# Patient Record
Sex: Female | Born: 1970 | Race: White | Hispanic: No | Marital: Married | Smoking: Never smoker
Health system: Southern US, Community
[De-identification: ages and names within clinical notes are randomized; demographics above are authoritative.]

## PROBLEM LIST (undated history)

## (undated) DIAGNOSIS — R519 Headache, unspecified: Secondary | ICD-10-CM

## (undated) DIAGNOSIS — Z9889 Other specified postprocedural states: Secondary | ICD-10-CM

## (undated) DIAGNOSIS — C539 Malignant neoplasm of cervix uteri, unspecified: Secondary | ICD-10-CM

## (undated) DIAGNOSIS — K219 Gastro-esophageal reflux disease without esophagitis: Secondary | ICD-10-CM

## (undated) HISTORY — DX: Malignant neoplasm of cervix uteri, unspecified: C53.9

## (undated) HISTORY — PX: CHOLECYSTECTOMY: SHX55

---

## 2010-10-20 ENCOUNTER — Ambulatory Visit
Admission: RE | Admit: 2010-10-20 | Discharge: 2010-10-20 | Payer: Self-pay | Source: Home / Self Care | Admitting: Gynecology

## 2010-11-10 ENCOUNTER — Encounter: Payer: Self-pay | Admitting: Obstetrics & Gynecology

## 2010-11-10 ENCOUNTER — Inpatient Hospital Stay (HOSPITAL_COMMUNITY): Admission: RE | Admit: 2010-11-10 | Discharge: 2010-11-12 | Payer: Self-pay | Admitting: Obstetrics & Gynecology

## 2010-11-10 HISTORY — PX: ABDOMINAL HYSTERECTOMY: SHX81

## 2010-12-16 ENCOUNTER — Ambulatory Visit
Admission: RE | Admit: 2010-12-16 | Discharge: 2010-12-16 | Payer: Self-pay | Source: Home / Self Care | Attending: Gynecology | Admitting: Gynecology

## 2011-03-02 LAB — BASIC METABOLIC PANEL
BUN: 6 mg/dL (ref 6–23)
Chloride: 105 mEq/L (ref 96–112)
GFR calc Af Amer: >60 mL/min (ref >60)
GFR calc non Af Amer: >60 mL/min (ref >60)
Potassium: 4.8 mEq/L (ref 3.5–5.1)
Sodium: 136 mEq/L (ref 135–145)

## 2011-03-02 LAB — COMPREHENSIVE METABOLIC PANEL
ALT: 27 U/L (ref 0–35)
AST: 25 U/L (ref 0–37)
Albumin: 3 g/dL — ABNORMAL LOW (ref 3.5–5.2)
CO2: 28 mEq/L (ref 19–32)
Calcium: 9.3 mg/dL (ref 8.4–10.5)
Creatinine, Ser: 0.89 mg/dL (ref 0.4–1.2)
GFR calc Af Amer: >60 mL/min (ref >60)
GFR calc non Af Amer: >60 mL/min (ref >60)
Sodium: 141 mEq/L (ref 135–145)
Total Protein: 7.4 g/dL (ref 6.0–8.3)

## 2011-03-02 LAB — ABO/RH: ABO/RH(D): A POS

## 2011-03-02 LAB — DIFFERENTIAL
Eosinophils Absolute: 0.1 10*3/uL (ref 0.0–0.7)
Eosinophils Relative: 2 % (ref 0–5)
Lymphocytes Relative: 28 % (ref 12–46)
Lymphs Abs: 1.9 10*3/uL (ref 0.7–4.0)
Monocytes Absolute: 0.4 10*3/uL (ref 0.1–1.0)
Monocytes Relative: 6 % (ref 3–12)

## 2011-03-02 LAB — CBC
HCT: 32.8 % — ABNORMAL LOW (ref 36.0–46.0)
HCT: 34.8 % — ABNORMAL LOW (ref 36.0–46.0)
Hemoglobin: 11.3 g/dL — ABNORMAL LOW (ref 12.0–15.0)
Hemoglobin: 14.6 g/dL (ref 12.0–15.0)
MCH: 29.6 pg (ref 26.0–34.0)
MCH: 29.6 pg (ref 26.0–34.0)
MCHC: 34.8 g/dL (ref 30.0–36.0)
MCV: 85 fL (ref 78.0–100.0)
Platelets: 286 10*3/uL (ref 150–400)
RBC: 3.8 MIL/uL — ABNORMAL LOW (ref 3.87–5.11)
RBC: 4.09 MIL/uL (ref 3.87–5.11)
RDW: 13 % (ref 11.5–15.5)
WBC: 13.1 10*3/uL — ABNORMAL HIGH (ref 4.0–10.5)

## 2011-03-02 LAB — SURGICAL PCR SCREEN
MRSA, PCR: NEGATIVE
Staphylococcus aureus: POSITIVE — AB

## 2011-03-02 LAB — PREGNANCY, URINE: Preg Test, Ur: NEGATIVE

## 2011-03-02 LAB — TYPE AND SCREEN

## 2011-06-11 ENCOUNTER — Other Ambulatory Visit: Payer: Self-pay | Admitting: Gynecology

## 2011-06-11 ENCOUNTER — Ambulatory Visit: Payer: Managed Care, Other (non HMO) | Attending: Gynecology | Admitting: Gynecology

## 2011-06-11 ENCOUNTER — Other Ambulatory Visit (HOSPITAL_COMMUNITY)
Admission: RE | Admit: 2011-06-11 | Discharge: 2011-06-11 | Disposition: A | Discharge status: Home / Self Care | Payer: Managed Care, Other (non HMO) | Source: Ambulatory Visit | Attending: Gynecology | Admitting: Gynecology

## 2011-06-11 DIAGNOSIS — Z8540 Personal history of malignant neoplasm of unspecified female genital organ: Secondary | ICD-10-CM | POA: Insufficient documentation

## 2011-06-11 DIAGNOSIS — C539 Malignant neoplasm of cervix uteri, unspecified: Secondary | ICD-10-CM | POA: Insufficient documentation

## 2011-06-11 DIAGNOSIS — Z9089 Acquired absence of other organs: Secondary | ICD-10-CM | POA: Insufficient documentation

## 2011-06-11 DIAGNOSIS — Z90710 Acquired absence of both cervix and uterus: Secondary | ICD-10-CM | POA: Insufficient documentation

## 2011-06-14 NOTE — Consult Note (Signed)
  Meghan Burgess, Meghan Burgess               ACCOUNT NO.:  [id number]  MEDICAL RECORD NO.:  [id number]  LOCATION:  GYN                          FACILITY:  [facility]  PHYSICIAN:  De Blanch, M.D.DATE OF BIRTH:  [birth year]  DATE OF CONSULTATION:  06/11/2011 DATE OF DISCHARGE:                                CONSULTATION   GYN/Oncology Clinic  CHIEF COMPLAINT:  Cervical cancer.  INTERVAL HISTORY:  Patient returns today for continuing followup.  She saw Dr. Lester McDade approximately 3 months ago.  At that time, she had an ASCUS Pap smear.  She denies any pelvic pain, pressure, vaginal bleeding or discharge.  She does note that she has some bladder hesitancy.  She is using cranberry pills and feels that it is making her bladder function better and has taken the odor out of her urine. Otherwise, she is doing well, working full-time and has no GI or GU symptoms.  HISTORY OF PRESENT ILLNESS:  Patient underwent a radical hysterectomy and pelvic lymphadenectomy on November 10, 2010 for squamous cell carcinoma of the cervix.  Final pathology showed no residual disease in the uterus or cervix and all lymph nodes and surgical margins were negative.  PAST MEDICAL HISTORY:  None.  PAST SURGICAL HISTORY: 1. Laparoscopic cholecystectomy. 2. Radical hysterectomy.  OBSTETRICAL HISTORY:  Gravida 0.  FAMILY HISTORY:  Negative for gynecologic, breast or colon cancer.  CURRENT MEDICATIONS:  None.  DRUG ALLERGIES: 1. ALEVE (GI upset). 2. ZITHROMAX (stomach upset).  SOCIAL HISTORY:  The patient is married.  She does not smoke.  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  REVIEW OF SYSTEMS:  Ten-point comprehensive review of systems negative except as noted above.  PHYSICAL EXAMINATION:  VITAL SIGNS:  Weight 210 pounds (up 10 pounds since December 2011), blood pressure 134/80, pulse 78, respiratory rate 20. GENERAL:  The patient is pleasant, white female, in no acute  distress. HEENT:  Negative. NECK:  Supple without thyromegaly.  There is no supraclavicular or inguinal adenopathy. ABDOMEN:  Soft, nontender.  No mass, organomegaly, ascites or hernias noted. PELVIC EXAMINATION:  EGBUS vagina, bladder, urethra normal.  Vaginal cuff is well-healed, well-supported.  No lesions are noted.  A Pap smears is obtained.  Bimanual and rectovaginal exam revealed no masses, induration or nodularity. EXTREMITIES:  Lower extremity:  Without edema or varicosities.  IMPRESSION:  Stage IBI adenocarcinoma of the cervix, clinically free of disease.  PLAN:  Pap smears are obtained.  The patient is  also encouraged to enter a weight reduction program. She return to see Dr. Lester [place] in 3 months and return to see me in 6 months.     De Blanch, M.D.     DC/MEDQ  D:  06/11/2011  T:  06/11/2011  Job:  [id number]  cc:   Alison Murray Fax: [phone]  Telford Nab, R.N. [phone] [address] [place], [place] [address]  Electronically Signed by De Blanch M.D. on 06/14/2011 01:05:56 PM

## 2011-12-16 ENCOUNTER — Encounter: Payer: Self-pay | Admitting: Gynecologic Oncology

## 2011-12-17 ENCOUNTER — Encounter: Payer: Self-pay | Admitting: Gynecology

## 2011-12-17 ENCOUNTER — Ambulatory Visit: Payer: Managed Care, Other (non HMO) | Attending: Gynecology | Admitting: Gynecology

## 2011-12-17 ENCOUNTER — Other Ambulatory Visit (HOSPITAL_COMMUNITY)
Admission: RE | Admit: 2011-12-17 | Discharge: 2011-12-17 | Disposition: A | Discharge status: Home / Self Care | Payer: Managed Care, Other (non HMO) | Source: Ambulatory Visit | Attending: Gynecology | Admitting: Gynecology

## 2011-12-17 VITALS — BP 128/84 | HR 80 | Temp 98.1°F | Resp 16 | Ht 67.72 in | Wt 221.1 lb

## 2011-12-17 DIAGNOSIS — Z90710 Acquired absence of both cervix and uterus: Secondary | ICD-10-CM | POA: Insufficient documentation

## 2011-12-17 DIAGNOSIS — R1032 Left lower quadrant pain: Secondary | ICD-10-CM | POA: Insufficient documentation

## 2011-12-17 DIAGNOSIS — C539 Malignant neoplasm of cervix uteri, unspecified: Secondary | ICD-10-CM | POA: Insufficient documentation

## 2011-12-17 DIAGNOSIS — Z01419 Encounter for gynecological examination (general) (routine) without abnormal findings: Secondary | ICD-10-CM | POA: Insufficient documentation

## 2011-12-17 NOTE — Progress Notes (Signed)
Consult Note: Gyn-Onc   Meghan Burgess 40 y.o. female  Chief Complaint  Patient presents with  . Cervical Cancer    Follow up    Interval History: Patient returns as previously scheduled for followup. She is now slightly over years since her radical hysterectomy. She saw Dr. Senaida Ores approximately 3 months ago. Since that visit she's done well she denies any GI or GU symptoms. She does have occasional left lower quadrant pain which is very intermittent. She has not had any pain in over 2 months. She denies any pelvic pain pressure vaginal bleeding or discharge. Sexual function is good and her activity level is normal.  HPI: Stage IB 1 adenocarcinoma cervix undergoing a radical hysterectomy and pelvic lymphadenectomy in November 2011. Final pathology showed no residual disease in the cervix and all lymph nodes and surgical margins were negative  Allergies  Allergen Reactions  . Aleve Nausea And Vomiting    GI upset  . Zithromax (Azithromycin)     GI upset    Past Medical History  Diagnosis Date  . Squamous cell carcinoma of cervix     Past Surgical History  Procedure Date  . Cholecystectomy   . Abdominal hysterectomy 11/10/10    Radical with pelvic lymphadenectomy    Current Outpatient Prescriptions  Medication Sig Dispense Refill  . CRANBERRY PO Take by mouth. OTC tabs, uncertain of dosage       . Multiple Vitamin (MULTIVITAMIN) tablet Take 1 tablet by mouth daily.          History   Social History  . Marital Status: Married    Spouse Name: N/A    Number of Children: N/A  . Years of Education: N/A   Occupational History  . Not on file.   Social History Main Topics  . Smoking status: Never Smoker   . Smokeless tobacco: Not on file  . Alcohol Use: No  . Drug Use: No  . Sexually Active: Yes   Other Topics Concern  . Not on file   Social History Narrative  . No narrative on file    Family History  Problem Relation Age of Onset  . Leukemia Mother   .  Leukemia Other     Review of Systems: 10 point review of systems negative except as noted above  Vitals: Blood pressure 128/84, pulse 80, temperature 98.1 F (36.7 C), temperature source Oral, resp. rate 16, height 5' 7.72" (1.72 m), weight 221 lb 1.6 oz (100.29 kg).  Physical Exam: In general this is a healthy white female no acute distress  HEENT is negative  Neck supple without thyromegaly  There is no supraclavicular or inguinal adenopathy. The abdomen is is obese soft nontender no masses again a megaly ascites or hernias are noted  Pelvic exam EGBUS vagina bladder urethra are normal the vaginal cuff is well supported no lesions are noted  Cervix and uterus surgically absent adnexa without masses or tenderness rectovaginal exam confirms  Lower extremities without edema or varicosities   Assessment/Plan: Stage IB 1 adenocarcinoma cervix clinically free of disease  Pap smears or obtained  Patient return to see Dr. Senaida Ores in 6 months and return to see [place] in one year  Weight loss is a discussed once again and the patient's encouraged to not only increase activity but to seek a week weight reducing diet.   Meghan Corpus, MD 12/17/2011, 3:56 PM  Consult Note: Gyn-Onc   Meghan Burgess 40 y.o. female  Chief Complaint  Patient presents with  . Cervical Cancer    Follow up    Interval History:   HPI:  Allergies  Allergen Reactions  . Aleve Nausea And Vomiting    GI upset  . Zithromax (Azithromycin)     GI upset    Past Medical History  Diagnosis Date  . Squamous cell carcinoma of cervix     Past Surgical History  Procedure Date  . Cholecystectomy   . Abdominal hysterectomy 11/10/10    Radical with pelvic lymphadenectomy    Current Outpatient Prescriptions  Medication Sig Dispense Refill  . CRANBERRY PO Take by mouth. OTC tabs, uncertain of dosage       . Multiple Vitamin (MULTIVITAMIN)  tablet Take 1 tablet by mouth daily.          History   Social History  . Marital Status: Married    Spouse Name: N/A    Number of Children: N/A  . Years of Education: N/A   Occupational History  . Not on file.   Social History Main Topics  . Smoking status: Never Smoker   . Smokeless tobacco: Not on file  . Alcohol Use: No  . Drug Use: No  . Sexually Active: Yes   Other Topics Concern  . Not on file   Social History Narrative  . No narrative on file    Family History  Problem Relation Age of Onset  . Leukemia Mother   . Leukemia Other     Review of Systems:  Vitals: Blood pressure 128/84, pulse 80, temperature 98.1 F (36.7 C), temperature source Oral, resp. rate 16, height 5' 7.72" (1.72 m), weight 221 lb 1.6 oz (100.29 kg).  Physical Exam:  Assessment/Plan:   Meghan Corpus, MD 12/17/2011, 3:56 PM

## 2011-12-17 NOTE — Patient Instructions (Signed)
Return in one year for follow-up   

## 2011-12-27 ENCOUNTER — Telehealth: Payer: Self-pay | Admitting: *Deleted

## 2011-12-27 NOTE — Telephone Encounter (Signed)
Message left for pt that pap was negative

## 2012-12-01 ENCOUNTER — Other Ambulatory Visit (HOSPITAL_COMMUNITY)
Admission: RE | Admit: 2012-12-01 | Discharge: 2012-12-01 | Disposition: A | Discharge status: Home / Self Care | Payer: BC Managed Care – PPO | Source: Ambulatory Visit | Attending: Gynecology | Admitting: Gynecology

## 2012-12-01 ENCOUNTER — Ambulatory Visit: Payer: BC Managed Care – PPO | Attending: Gynecology | Admitting: Gynecology

## 2012-12-01 ENCOUNTER — Encounter: Payer: Self-pay | Admitting: Gynecology

## 2012-12-01 VITALS — BP 130/78 | HR 80 | Temp 98.6°F | Resp 18 | Ht 67.72 in | Wt 204.4 lb

## 2012-12-01 DIAGNOSIS — Z90710 Acquired absence of both cervix and uterus: Secondary | ICD-10-CM | POA: Insufficient documentation

## 2012-12-01 DIAGNOSIS — C539 Malignant neoplasm of cervix uteri, unspecified: Secondary | ICD-10-CM | POA: Insufficient documentation

## 2012-12-01 DIAGNOSIS — Z01419 Encounter for gynecological examination (general) (routine) without abnormal findings: Secondary | ICD-10-CM | POA: Insufficient documentation

## 2012-12-01 DIAGNOSIS — Z09 Encounter for follow-up examination after completed treatment for conditions other than malignant neoplasm: Secondary | ICD-10-CM | POA: Insufficient documentation

## 2012-12-01 NOTE — Patient Instructions (Signed)
Return to see Dr. Senaida Ores in 6 months return to see [place] in one year. We will contact you if you're Pap smear report.

## 2012-12-01 NOTE — Progress Notes (Signed)
Consult Note: Gyn-Onc   Meghan Burgess 41 y.o. female  Chief Complaint  Patient presents with  . Cervical Cancer    Follow up    Interval History: Since her last visit the patient is seen Dr. Senaida Ores. Currently her exam was fine at that time. Since then she discontinued do well. Over the last several months she begun exercise program and has lost approximately 17 pounds. She has no GI symptoms. She does have some bladder has been seen. She denies any pelvic pain pressure vaginal bleeding or discharge. She still has some loss of sensation in the inner portion of her left thigh and inner abdomen above the Pfannenstiel incision. All of these are tolerable to the patient.  HPI: Stage IB 1 adenocarcinoma cervix undergoing a radical hysterectomy and pelvic lymphadenectomy in November 2011. Final pathology showed no residual disease in the cervix and all lymph nodes and surgical margins were negative   Review of Systems:10 point review of systems is negative as noted above.   Vitals: Blood pressure 130/78, pulse 80, temperature 98.6 F (37 C), temperature source Oral, resp. rate 18, height 5' 7.72" (1.72 m), weight 204 lb 6.4 oz (92.715 kg).  Physical Exam: General : The patient is a healthy woman in no acute distress.  HEENT: normocephalic, extraoccular movements normal; neck is supple without thyromegally  Lynphnodes: Supraclavicular and inguinal nodes not enlarged  Abdomen: Soft, non-tender, no ascites, no organomegally, no masses, no hernias Pfannenstiel incision is well-healed Pelvic:  EGBUS: Normal female  Vagina: Normal, no lesions  Urethra and Bladder: Normal, non-tender  Cervix: Surgically absent  Uterus: Surgically absent  Bi-manual examination: Non-tender; no adenxal masses or nodularity  Rectal: normal sphincter tone, no masses, no blood  Lower extremities: No edema or varicosities. Normal range of motion    Assessment/Plan: Stage IB 1 adenocarcinoma of cervix November  2011. Patient's clinically free of disease. Pap smears are obtained.  The patient's encouraged to continue her weight loss program.  She returned to see Dr. Senaida Ores 6 months return to see [place] in one year.  Allergies  Allergen Reactions  . Naproxen Sodium Nausea And Vomiting    GI upset  . Zithromax (Azithromycin)     GI upset    Past Medical History  Diagnosis Date  . Cervical cancer     adenocarcinoma of cervix    Past Surgical History  Procedure Date  . Cholecystectomy   . Abdominal hysterectomy 11/10/10    Radical with pelvic lymphadenectomy    Current Outpatient Prescriptions  Medication Sig Dispense Refill  . CRANBERRY PO Take by mouth. OTC tabs, uncertain of dosage       . Multiple Vitamin (MULTIVITAMIN) tablet Take 1 tablet by mouth daily.          History   Social History  . Marital Status: Married    Spouse Name: N/A    Number of Children: N/A  . Years of Education: N/A   Occupational History  . Not on file.   Social History Main Topics  . Smoking status: Never Smoker   . Smokeless tobacco: Not on file  . Alcohol Use: No  . Drug Use: No  . Sexually Active: Yes   Other Topics Concern  . Not on file   Social History Narrative  . No narrative on file    Family History  Problem Relation Age of Onset  . Leukemia Mother   . Leukemia Other       [name], MD 12/01/2012,  11:49 AM

## 2012-12-08 ENCOUNTER — Telehealth: Payer: Self-pay | Admitting: Gynecologic Oncology

## 2012-12-08 NOTE — Telephone Encounter (Signed)
Message left for patient with pap smear results: negative.  Instructed to call for any questions or concerns.  

## 2022-02-19 ENCOUNTER — Ambulatory Visit: Payer: Self-pay | Admitting: Orthopedic Surgery

## 2022-03-02 ENCOUNTER — Ambulatory Visit: Payer: Self-pay | Admitting: Student

## 2022-03-02 NOTE — H&P (View-Only) (Signed)
TOTAL HIP ADMISSION H&P ? ?Patient is admitted for right total hip arthroplasty. ? ?Subjective: ? ?Chief Complaint: right hip pain ? ?HPI: Meghan Burgess, 51 y.o. female, has a history of pain and functional disability in the right hip(s) due to arthritis and patient has failed non-surgical conservative treatments for greater than 12 weeks to include NSAID's and/or analgesics, flexibility and strengthening excercises, and activity modification.  Onset of symptoms was gradual starting >10 years ago with rapidlly worsening course since that time.The patient noted no past surgery on the right hip(s).  Patient currently rates pain in the right hip at 10 out of 10 with activity. Patient has night pain, worsening of pain with activity and weight bearing, trendelenberg gait, pain that interfers with activities of daily living, pain with passive range of motion, and joint swelling. Patient has evidence of subchondral cysts, subchondral sclerosis, periarticular osteophytes, and joint space narrowing by imaging studies. This condition presents safety issues increasing the risk of falls. There is no current active infection. ? ?Patient Active Problem List  ? Diagnosis Date Noted  ? Cervix cancer ([place]) 12/17/2011  ? ?Past Medical History:  ?Diagnosis Date  ? Cervical cancer ([place])   ? adenocarcinoma of cervix  ?  ?Past Surgical History:  ?Procedure Laterality Date  ? ABDOMINAL HYSTERECTOMY  11/10/10  ? Radical with pelvic lymphadenectomy  ? CHOLECYSTECTOMY    ?  ?Current Outpatient Medications  ?Medication Sig Dispense Refill Last Dose  ? CRANBERRY PO Take by mouth. OTC tabs, uncertain of dosage      ? Multiple Vitamin (MULTIVITAMIN) tablet Take 1 tablet by mouth daily.       ? ?No current facility-administered medications for this visit.  ? ?Allergies  ?Allergen Reactions  ? Naproxen Sodium Nausea And Vomiting  ?  GI upset  ? Zithromax [Azithromycin]   ?  GI upset  ?  ?Social History  ? ?Tobacco Use  ? Smoking status: Never  ?  Smokeless tobacco: Not on file  ?Substance Use Topics  ? Alcohol use: No  ?  ?Family History  ?Problem Relation Age of Onset  ? Leukemia Mother   ? Leukemia Other   ?  ? ?Review of Systems  ?Musculoskeletal:  Positive for arthralgias and gait problem.  ?All other systems reviewed and are negative. ? ?Objective: ? ?Physical Exam ?Constitutional:   ?   Appearance: She is obese.  ?HENT:  ?   Head: Normocephalic and atraumatic.  ?   Nose: Nose normal.  ?   Mouth/Throat:  ?   Mouth: Mucous membranes are moist.  ?   Pharynx: Oropharynx is clear.  ?Eyes:  ?   Extraocular Movements: Extraocular movements intact.  ?   Conjunctiva/sclera: Conjunctivae normal.  ?   Pupils: Pupils are equal, round, and reactive to light.  ?Cardiovascular:  ?   Rate and Rhythm: Normal rate and regular rhythm.  ?   Pulses: Normal pulses.  ?   Heart sounds: Normal heart sounds.  ?Pulmonary:  ?   Effort: Pulmonary effort is normal. No respiratory distress.  ?   Breath sounds: Normal breath sounds.  ?Abdominal:  ?   General: Bowel sounds are normal.  ?   Palpations: Abdomen is soft.  ?Genitourinary: ?   Comments: deferred ?Musculoskeletal:  ?   Right hip: Bony tenderness present. Decreased range of motion. Decreased strength.  ?Skin: ?   General: Skin is warm and dry.  ?   Capillary Refill: Capillary refill takes less than 2 seconds.  ?  Neurological:  ?   General: No focal deficit present.  ?   Mental Status: She is alert and oriented to person, place, and time.  ?Psychiatric:     ?   Mood and Affect: Mood normal.     ?   Behavior: Behavior normal.     ?   Thought Content: Thought content normal.     ?   Judgment: Judgment normal.  ? ? ?Vital signs in last 24 hours: ?'@VSRANGES'$ @ ? ?Labs: ? ? ?Estimated body mass index is 31.34 kg/m? as calculated from the following: ?  Height as of 12/01/12: 5' 7.72" (1.72 m). ?  Weight as of 12/01/12: 92.7 kg. ? ? ?Imaging Review ?Plain radiographs demonstrate severe degenerative joint disease of the right hip(s).  The bone quality appears to be adequate for age and reported activity level. ? ? ? ? ? ?Assessment/Plan: ? ?End stage arthritis, right hip(s) ? ?The patient history, physical examination, clinical judgement of the provider and imaging studies are consistent with end stage degenerative joint disease of the right hip(s) and total hip arthroplasty is deemed medically necessary. The treatment options including medical management, injection therapy, arthroscopy and arthroplasty were discussed at length. The risks and benefits of total hip arthroplasty were presented and reviewed. The risks due to aseptic loosening, infection, stiffness, dislocation/subluxation,  thromboembolic complications and other imponderables were discussed.  The patient acknowledged the explanation, agreed to proceed with the plan and consent was signed. Patient is being admitted for inpatient treatment for surgery, pain control, PT, OT, prophylactic antibiotics, VTE prophylaxis, progressive ambulation and ADL's and discharge planning.The patient is planning to be discharged  Home with HEP. ? ? ? ?Patient's anticipated LOS is less than 2 midnights, meeting these requirements: ?- Younger than 67 ?- Lives within 1 hour of care ?- Has a competent adult at home to recover with post-op recover ?- NO history of ? - Chronic pain requiring opiods ? - Diabetes ? - Coronary Artery Disease ? - Heart failure ? - Heart attack ? - Stroke ? - DVT/VTE ? - Cardiac arrhythmia ? - Respiratory Failure/COPD ? - Renal failure ? - Anemia ? - Advanced Liver disease ? ? ?

## 2022-03-02 NOTE — H&P (Signed)
TOTAL HIP ADMISSION H&P ? ?Patient is admitted for right total hip arthroplasty. ? ?Subjective: ? ?Chief Complaint: right hip pain ? ?HPI: Meghan Burgess, 51 y.o. female, has a history of pain and functional disability in the right hip(s) due to arthritis and patient has failed non-surgical conservative treatments for greater than 12 weeks to include NSAID's and/or analgesics, flexibility and strengthening excercises, and activity modification.  Onset of symptoms was gradual starting >10 years ago with rapidlly worsening course since that time.The patient noted no past surgery on the right hip(s).  Patient currently rates pain in the right hip at 10 out of 10 with activity. Patient has night pain, worsening of pain with activity and weight bearing, trendelenberg gait, pain that interfers with activities of daily living, pain with passive range of motion, and joint swelling. Patient has evidence of subchondral cysts, subchondral sclerosis, periarticular osteophytes, and joint space narrowing by imaging studies. This condition presents safety issues increasing the risk of falls. There is no current active infection. ? ?Patient Active Problem List  ? Diagnosis Date Noted  ? Cervix cancer ([place]) 12/17/2011  ? ?Past Medical History:  ?Diagnosis Date  ? Cervical cancer ([place])   ? adenocarcinoma of cervix  ?  ?Past Surgical History:  ?Procedure Laterality Date  ? ABDOMINAL HYSTERECTOMY  11/10/10  ? Radical with pelvic lymphadenectomy  ? CHOLECYSTECTOMY    ?  ?Current Outpatient Medications  ?Medication Sig Dispense Refill Last Dose  ? CRANBERRY PO Take by mouth. OTC tabs, uncertain of dosage      ? Multiple Vitamin (MULTIVITAMIN) tablet Take 1 tablet by mouth daily.       ? ?No current facility-administered medications for this visit.  ? ?Allergies  ?Allergen Reactions  ? Naproxen Sodium Nausea And Vomiting  ?  GI upset  ? Zithromax [Azithromycin]   ?  GI upset  ?  ?Social History  ? ?Tobacco Use  ? Smoking status: Never  ?  Smokeless tobacco: Not on file  ?Substance Use Topics  ? Alcohol use: No  ?  ?Family History  ?Problem Relation Age of Onset  ? Leukemia Mother   ? Leukemia Other   ?  ? ?Review of Systems  ?Musculoskeletal:  Positive for arthralgias and gait problem.  ?All other systems reviewed and are negative. ? ?Objective: ? ?Physical Exam ?Constitutional:   ?   Appearance: She is obese.  ?HENT:  ?   Head: Normocephalic and atraumatic.  ?   Nose: Nose normal.  ?   Mouth/Throat:  ?   Mouth: Mucous membranes are moist.  ?   Pharynx: Oropharynx is clear.  ?Eyes:  ?   Extraocular Movements: Extraocular movements intact.  ?   Conjunctiva/sclera: Conjunctivae normal.  ?   Pupils: Pupils are equal, round, and reactive to light.  ?Cardiovascular:  ?   Rate and Rhythm: Normal rate and regular rhythm.  ?   Pulses: Normal pulses.  ?   Heart sounds: Normal heart sounds.  ?Pulmonary:  ?   Effort: Pulmonary effort is normal. No respiratory distress.  ?   Breath sounds: Normal breath sounds.  ?Abdominal:  ?   General: Bowel sounds are normal.  ?   Palpations: Abdomen is soft.  ?Genitourinary: ?   Comments: deferred ?Musculoskeletal:  ?   Right hip: Bony tenderness present. Decreased range of motion. Decreased strength.  ?Skin: ?   General: Skin is warm and dry.  ?   Capillary Refill: Capillary refill takes less than 2 seconds.  ?  Neurological:  ?   General: No focal deficit present.  ?   Mental Status: She is alert and oriented to person, place, and time.  ?Psychiatric:     ?   Mood and Affect: Mood normal.     ?   Behavior: Behavior normal.     ?   Thought Content: Thought content normal.     ?   Judgment: Judgment normal.  ? ? ?Vital signs in last 24 hours: ?'@VSRANGES'$ @ ? ?Labs: ? ? ?Estimated body mass index is 31.34 kg/m? as calculated from the following: ?  Height as of 12/01/12: 5' 7.72" (1.72 m). ?  Weight as of 12/01/12: 92.7 kg. ? ? ?Imaging Review ?Plain radiographs demonstrate severe degenerative joint disease of the right hip(s).  The bone quality appears to be adequate for age and reported activity level. ? ? ? ? ? ?Assessment/Plan: ? ?End stage arthritis, right hip(s) ? ?The patient history, physical examination, clinical judgement of the provider and imaging studies are consistent with end stage degenerative joint disease of the right hip(s) and total hip arthroplasty is deemed medically necessary. The treatment options including medical management, injection therapy, arthroscopy and arthroplasty were discussed at length. The risks and benefits of total hip arthroplasty were presented and reviewed. The risks due to aseptic loosening, infection, stiffness, dislocation/subluxation,  thromboembolic complications and other imponderables were discussed.  The patient acknowledged the explanation, agreed to proceed with the plan and consent was signed. Patient is being admitted for inpatient treatment for surgery, pain control, PT, OT, prophylactic antibiotics, VTE prophylaxis, progressive ambulation and ADL's and discharge planning.The patient is planning to be discharged  Home with HEP. ? ? ? ?Patient's anticipated LOS is less than 2 midnights, meeting these requirements: ?- Younger than 50 ?- Lives within 1 hour of care ?- Has a competent adult at home to recover with post-op recover ?- NO history of ? - Chronic pain requiring opiods ? - Diabetes ? - Coronary Artery Disease ? - Heart failure ? - Heart attack ? - Stroke ? - DVT/VTE ? - Cardiac arrhythmia ? - Respiratory Failure/COPD ? - Renal failure ? - Anemia ? - Advanced Liver disease ? ? ?

## 2022-03-09 NOTE — Progress Notes (Addendum)
COVID swab appointment: ? ?COVID Vaccine Completed: yes x4 ?Date COVID Vaccine completed: 08/15/20, 09/12/20 ?Has received booster: 03/05/21, 07/24/21 ?COVID vaccine manufacturer: [employer]  ? ?Date of COVID positive in last 90 days: no ? ?PCP - Georgeanne Nim, NP ?Cardiologist - n/a ? ?Medical clearance by Ella Jubilee 02/16/22 on chart ? ?Chest x-ray - n/a ?EKG - n/a ?Stress Test - n/a ?ECHO - n/a ?Cardiac Cath - n/a ?Pacemaker/ICD device last checked: n/a ?Spinal Cord Stimulator: n/a ? ?Bowel Prep - no ? ?Sleep Study - n/a ?CPAP -  ? ?Fasting Blood Sugar - n/a ?Checks Blood Sugar _____ times a day ? ?Blood Thinner Instructions: n/a ?Aspirin Instructions: ?Last Dose: ? ?Activity level: Can go up a flight of stairs and perform activities of daily living without stopping and without symptoms of chest pain or shortness of breath. ?   ?Anesthesia review:  ? ?Patient denies shortness of breath, fever, cough and chest pain at PAT appointment ? ? ?Patient verbalized understanding of instructions that were given to them at the PAT appointment. Patient was also instructed that they will need to review over the PAT instructions again at home before surgery.  ?

## 2022-03-09 NOTE — Patient Instructions (Addendum)
DUE TO COVID-19 ONLY ONE VISITOR  (aged 50 and older)  IS ALLOWED TO COME WITH YOU AND STAY IN THE WAITING ROOM ONLY DURING PRE OP AND PROCEDURE.   ?**NO VISITORS ARE ALLOWED IN THE SHORT STAY AREA OR RECOVERY ROOM!!** ? ? Your procedure is scheduled on: 03/17/22 ? ? Report to [facility] Main Entrance ? ?  Report to admitting at 6:00 AM ? ? Call this number if you have problems the morning of surgery [phone] ? ? Do not eat food :After Midnight. ? ? After Midnight you may have the following liquids until 4:30 AM DAY OF SURGERY ? ?Water ?Black Coffee (sugar ok, NO MILK/CREAM OR CREAMERS)  ?Tea (sugar ok, NO MILK/CREAM OR CREAMERS) regular and decaf                             ?Plain Jell-O (NO RED)                                           ?Fruit ices (not with fruit pulp, NO RED)                                     ?Popsicles (NO RED)                                                                  ?Juice: apple, WHITE grape, WHITE cranberry ?Sports drinks like Gatorade (NO RED) ?Clear broth(vegetable,chicken,beef) ?  ?  ?The day of surgery:  ?Drink ONE (1) Pre-Surgery Clear Ensure at 4:30 AM the morning of surgery. Drink in one sitting. Do not sip.  ?This drink was given to you during your hospital  ?pre-op appointment visit. ?Nothing else to drink after completing the  ?Pre-Surgery Clear Ensure. ?  ?       If you have questions, please contact your surgeon?s office. ? ? ?FOLLOW BOWEL PREP AND ANY ADDITIONAL PRE OP INSTRUCTIONS YOU RECEIVED FROM YOUR SURGEON'S OFFICE!!! ?  ?  ?Oral Hygiene is also important to reduce your risk of infection.                                    ?Remember - BRUSH YOUR TEETH THE MORNING OF SURGERY WITH YOUR REGULAR TOOTHPASTE ? ? Take these medicines the morning of surgery with A SIP OF WATER: Tylenol. ?                  ?           You may not have any metal on your body including hair pins, jewelry, and body piercing ? ?           Do not wear make-up, lotions, powders,  perfumes, or deodorant ? ?Do not wear nail polish including gel and S&S, artificial/acrylic nails, or any other type of covering on natural nails including finger and toenails. If you have artificial nails, gel coating, etc. that needs to be  removed by a nail salon please have this removed prior to surgery or surgery may need to be canceled/ delayed if the surgeon/ anesthesia feels like they are unable to be safely monitored.  ? ?Do not shave  48 hours prior to surgery.  ? ? Do not bring valuables to the hospital. [place] NOT ?            RESPONSIBLE   FOR VALUABLES. ?  ? Patients discharged on the day of surgery will not be allowed to drive home.  Someone NEEDS to stay with you for the first 24 hours after anesthesia. ? ? Special Instructions: Bring a copy of your healthcare power of attorney and living will documents         the day of surgery if you haven't scanned them before. ? ?            Please read over the following fact sheets you were given: IF [place] [phone]- Apolonio Schneiders ? ?   [place] - Preparing for Surgery ?Before surgery, you can play an important role.  Because skin is not sterile, your skin needs to be as free of germs as possible.  You can reduce the number of germs on your skin by washing with CHG (chlorahexidine gluconate) soap before surgery.  CHG is an antiseptic cleaner which kills germs and bonds with the skin to continue killing germs even after washing. ?Please DO NOT use if you have an allergy to CHG or antibacterial soaps.  If your skin becomes reddened/irritated stop using the CHG and inform your nurse when you arrive at Short Stay. ?Do not shave (including legs and underarms) for at least 48 hours prior to the first CHG shower.  You may shave your face/neck. ? ?Please follow these instructions carefully: ? 1.  Shower with CHG Soap the night before surgery and the  morning of surgery. ? 2.  If you choose to wash your  hair, wash your hair first as usual with your normal  shampoo. ? 3.  After you shampoo, rinse your hair and body thoroughly to remove the shampoo.                            ? 4.  Use CHG as you would any other liquid soap.  You can apply chg directly to the skin and wash.  Gently with a scrungie or clean washcloth. ? 5.  Apply the CHG Soap to your body ONLY FROM THE NECK DOWN.   Do   not use on face/ open      ?                     Wound or open sores. Avoid contact with eyes, ears mouth and   genitals (private parts).  ?                     Production manager,  Genitals (private parts) with your normal soap. ?            6.  Wash thoroughly, paying special attention to the area where your    surgery  will be performed. ? 7.  Thoroughly rinse your body with warm water from the neck down. ? 8.  DO NOT shower/wash with your normal soap after using and rinsing off the CHG Soap. ?  9.  Pat yourself dry with a clean towel. ?           10.  Wear clean pajamas. ?           11.  Place clean sheets on your bed the night of your first shower and do not  sleep with pets. ?Day of Surgery : ?Do not apply any lotions/deodorants the morning of surgery.  Please wear clean clothes to the hospital/surgery center. ? ?FAILURE TO FOLLOW THESE INSTRUCTIONS MAY RESULT IN THE CANCELLATION OF YOUR SURGERY ? ?PATIENT SIGNATURE_________________________________ ? ?NURSE SIGNATURE__________________________________ ? ?________________________________________________________________________  ? ?Incentive Spirometer ? ?An incentive spirometer is a tool that can help keep your lungs clear and active. This tool measures how well you are filling your lungs with each breath. Taking long deep breaths may help reverse or decrease the chance of developing breathing (pulmonary) problems (especially infection) following: ?A long period of time when you are unable to move or be active. ?BEFORE THE PROCEDURE  ?If the spirometer includes an indicator to  show your best effort, your nurse or respiratory therapist will set it to a desired goal. ?If possible, sit up straight or lean slightly forward. Try not to slouch. ?Hold the incentive spirometer in an upright position. ?INSTRUCTIONS FOR USE  ?Sit on the edge of your bed if possible, or sit up as far as you can in bed or on a chair. ?Hold the incentive spirometer in an upright position. ?Breathe out normally. ?Place the mouthpiece in your mouth and seal your lips tightly around it. ?Breathe in slowly and as deeply as possible, raising the piston or the ball toward the top of the column. ?Hold your breath for 3-5 seconds or for as long as possible. Allow the piston or ball to fall to the bottom of the column. ?Remove the mouthpiece from your mouth and breathe out normally. ?Rest for a few seconds and repeat Steps 1 through 7 at least 10 times every 1-2 hours when you are awake. Take your time and take a few normal breaths between deep breaths. ?The spirometer may include an indicator to show your best effort. Use the indicator as a goal to work toward during each repetition. ?After each set of 10 deep breaths, practice coughing to be sure your lungs are clear. If you have an incision (the cut made at the time of surgery), support your incision when coughing by placing a pillow or rolled up towels firmly against it. ?Once you are able to get out of bed, walk around indoors and cough well. You may stop using the incentive spirometer when instructed by your caregiver.  ?RISKS AND COMPLICATIONS ?Take your time so you do not get dizzy or light-headed. ?If you are in pain, you may need to take or ask for pain medication before doing incentive spirometry. It is harder to take a deep breath if you are having pain. ?AFTER USE ?Rest and breathe slowly and easily. ?It can be helpful to keep track of a log of your progress. Your caregiver can provide you with a simple table to help with this. ?If you are using the spirometer at  home, follow these instructions: ?SEEK MEDICAL CARE IF:  ?You are having difficultly using the spirometer. ?You have trouble using the spirometer as often as instructed. ?Your pain medication is not giving

## 2022-03-10 ENCOUNTER — Encounter (HOSPITAL_COMMUNITY): Payer: Self-pay

## 2022-03-10 ENCOUNTER — Encounter (HOSPITAL_COMMUNITY)
Admission: RE | Admit: 2022-03-10 | Discharge: 2022-03-10 | Disposition: A | Discharge status: Home / Self Care | Payer: PRIVATE HEALTH INSURANCE | Source: Ambulatory Visit | Attending: Orthopedic Surgery | Admitting: Orthopedic Surgery

## 2022-03-10 ENCOUNTER — Other Ambulatory Visit: Payer: Self-pay

## 2022-03-10 VITALS — BP 141/95 | HR 84 | Temp 98.4°F | Resp 16 | Ht 63.5 in | Wt 224.0 lb

## 2022-03-10 DIAGNOSIS — Z01812 Encounter for preprocedural laboratory examination: Secondary | ICD-10-CM | POA: Diagnosis not present

## 2022-03-10 DIAGNOSIS — Z01818 Encounter for other preprocedural examination: Secondary | ICD-10-CM

## 2022-03-10 HISTORY — DX: Headache, unspecified: R51.9

## 2022-03-10 HISTORY — DX: Gastro-esophageal reflux disease without esophagitis: K21.9

## 2022-03-10 LAB — SURGICAL PCR SCREEN
MRSA, PCR: NEGATIVE
Staphylococcus aureus: POSITIVE — AB

## 2022-03-10 LAB — CBC
HCT: 46 % (ref 36.0–46.0)
Hemoglobin: 15.7 g/dL — ABNORMAL HIGH (ref 12.0–15.0)
MCH: 29.9 pg (ref 26.0–34.0)
MCHC: 34.1 g/dL (ref 30.0–36.0)
MCV: 87.6 fL (ref 80.0–100.0)
Platelets: 300 10*3/uL (ref 150–400)
RBC: 5.25 MIL/uL — ABNORMAL HIGH (ref 3.87–5.11)
RDW: 12.2 % (ref 11.5–15.5)
WBC: 5.5 10*3/uL (ref 4.0–10.5)
nRBC: 0 % (ref 0.0–0.2)

## 2022-03-16 NOTE — Anesthesia Preprocedure Evaluation (Addendum)
Anesthesia Evaluation  ?Patient identified by MRN, date of birth, ID band ?Patient awake ? ? ? ?Reviewed: ?Allergy & Precautions, H&P , NPO status , Patient's Chart, lab work & pertinent test results ? ?Airway ?Mallampati: II ? ?TM Distance: >3 FB ?Neck ROM: Full ? ? ? Dental ?no notable dental hx. ?(+) Teeth Intact, Dental Advisory Given ?  ?Pulmonary ?neg pulmonary ROS,  ?  ?Pulmonary exam normal ?breath sounds clear to auscultation ? ? ? ? ? ? Cardiovascular ?Exercise Tolerance: Good ?negative cardio ROS ? ? ?Rhythm:Regular Rate:Normal ? ? ?  ?Neuro/Psych ? Headaches, negative psych ROS  ? GI/Hepatic ?Neg liver ROS, GERD  Medicated,  ?Endo/Other  ?Morbid obesity ? Renal/GU ?negative Renal ROS  ?negative genitourinary ?  ?Musculoskeletal ? ? Abdominal ?  ?Peds ? Hematology ?negative hematology ROS ?(+)   ?Anesthesia Other Findings ? ? Reproductive/Obstetrics ?negative OB ROS ? ?  ? ? ? ? ? ? ? ? ? ? ? ? ? ?  ?  ? ? ? ? ? ? ? ?Anesthesia Physical ?Anesthesia Plan ? ?ASA: 3 ? ?Anesthesia Plan: Spinal  ? ?Post-op Pain Management: Tylenol PO (pre-op)*  ? ?Induction: Intravenous ? ?PONV Risk Score and Plan: 3 ? ?Airway Management Planned: Natural Airway and Simple Face Mask ? ?Additional Equipment:  ? ?Intra-op Plan:  ? ?Post-operative Plan:  ? ?Informed Consent: I have reviewed the patients History and Physical, chart, labs and discussed the procedure including the risks, benefits and alternatives for the proposed anesthesia with the patient or authorized representative who has indicated his/her understanding and acceptance.  ? ? ? ?Dental advisory given ? ?Plan Discussed with: CRNA ? ?Anesthesia Plan Comments:   ? ? ? ? ? ?Anesthesia Quick Evaluation ? ?

## 2022-03-17 ENCOUNTER — Other Ambulatory Visit: Payer: Self-pay

## 2022-03-17 ENCOUNTER — Ambulatory Visit (HOSPITAL_COMMUNITY): Payer: PRIVATE HEALTH INSURANCE

## 2022-03-17 ENCOUNTER — Ambulatory Visit (HOSPITAL_COMMUNITY): Payer: PRIVATE HEALTH INSURANCE | Admitting: Certified Registered"

## 2022-03-17 ENCOUNTER — Ambulatory Visit (HOSPITAL_BASED_OUTPATIENT_CLINIC_OR_DEPARTMENT_OTHER): Payer: PRIVATE HEALTH INSURANCE | Admitting: Certified Registered"

## 2022-03-17 ENCOUNTER — Observation Stay (HOSPITAL_COMMUNITY)
Admission: RE | Admit: 2022-03-17 | Discharge: 2022-03-18 | Disposition: A | Discharge status: Home / Self Care | Payer: PRIVATE HEALTH INSURANCE | Attending: Orthopedic Surgery | Admitting: Orthopedic Surgery

## 2022-03-17 ENCOUNTER — Encounter (HOSPITAL_COMMUNITY): Payer: Self-pay | Admitting: Orthopedic Surgery

## 2022-03-17 ENCOUNTER — Encounter (HOSPITAL_COMMUNITY)
Admission: RE | Disposition: A | Discharge status: Home / Self Care | Payer: Self-pay | Source: Home / Self Care | Attending: Orthopedic Surgery

## 2022-03-17 DIAGNOSIS — Z6839 Body mass index (BMI) 39.0-39.9, adult: Secondary | ICD-10-CM | POA: Diagnosis not present

## 2022-03-17 DIAGNOSIS — Z8541 Personal history of malignant neoplasm of cervix uteri: Secondary | ICD-10-CM | POA: Insufficient documentation

## 2022-03-17 DIAGNOSIS — M1611 Unilateral primary osteoarthritis, right hip: Principal | ICD-10-CM | POA: Insufficient documentation

## 2022-03-17 DIAGNOSIS — Z96641 Presence of right artificial hip joint: Secondary | ICD-10-CM | POA: Diagnosis present

## 2022-03-17 HISTORY — DX: Other specified postprocedural states: Z98.890

## 2022-03-17 HISTORY — PX: TOTAL HIP ARTHROPLASTY: SHX124

## 2022-03-17 LAB — TYPE AND SCREEN
ABO/RH(D): A POS
Antibody Screen: NEGATIVE

## 2022-03-17 SURGERY — ARTHROPLASTY, HIP, TOTAL, ANTERIOR APPROACH
Anesthesia: Spinal | Site: Hip | Laterality: Right

## 2022-03-17 MED ORDER — ACETAMINOPHEN 500 MG PO TABS: 1000.0000 mg | ORAL_TABLET | Freq: Once | ORAL | Status: AC

## 2022-03-17 MED ORDER — ALUM & MAG HYDROXIDE-SIMETH 200-200-20 MG/5ML PO SUSP: 30.0000 mL | ORAL | Status: DC | PRN

## 2022-03-17 MED ORDER — ASPIRIN 81 MG PO CHEW
81.0000 mg | CHEWABLE_TABLET | Freq: Two times a day (BID) | ORAL | Status: DC
  Administered 2022-03-17 – 2022-03-18 (×2): 81 mg via ORAL
  Filled 2022-03-17 (×2): qty 1

## 2022-03-17 MED ORDER — METHOCARBAMOL 500 MG PO TABS
500.0000 mg | ORAL_TABLET | Freq: Four times a day (QID) | ORAL | Status: DC | PRN
  Administered 2022-03-17: 500 mg via ORAL
  Filled 2022-03-17: qty 1

## 2022-03-17 MED ORDER — EPHEDRINE SULFATE-NACL 50-0.9 MG/10ML-% IV SOSY
PREFILLED_SYRINGE | INTRAVENOUS | Status: DC | PRN
  Administered 2022-03-17: 5 mg via INTRAVENOUS

## 2022-03-17 MED ORDER — WATER FOR IRRIGATION, STERILE IR SOLN
Status: DC | PRN
  Administered 2022-03-17: 3000 mL

## 2022-03-17 MED ORDER — ONDANSETRON HCL 4 MG/2ML IJ SOLN
INTRAMUSCULAR | Status: AC
  Filled 2022-03-17: qty 2

## 2022-03-17 MED ORDER — MELOXICAM 15 MG PO TABS: 15.0000 mg | ORAL_TABLET | Freq: Every day | ORAL | 3 refills | Status: AC

## 2022-03-17 MED ORDER — LACTATED RINGERS IV BOLUS
250.0000 mL | Freq: Once | INTRAVENOUS | Status: AC
  Administered 2022-03-17: 250 mL via INTRAVENOUS

## 2022-03-17 MED ORDER — PROPOFOL 10 MG/ML IV BOLUS
INTRAVENOUS | Status: DC | PRN
  Administered 2022-03-17: 30 mg via INTRAVENOUS

## 2022-03-17 MED ORDER — CEFAZOLIN SODIUM-DEXTROSE 2-4 GM/100ML-% IV SOLN
2.0000 g | INTRAVENOUS | Status: AC
  Administered 2022-03-17: 2 g via INTRAVENOUS

## 2022-03-17 MED ORDER — ONDANSETRON HCL 4 MG/2ML IJ SOLN
4.0000 mg | Freq: Four times a day (QID) | INTRAMUSCULAR | Status: DC | PRN
  Administered 2022-03-17: 4 mg via INTRAVENOUS

## 2022-03-17 MED ORDER — KETOROLAC TROMETHAMINE 30 MG/ML IJ SOLN
INTRAMUSCULAR | Status: DC | PRN
  Administered 2022-03-17: 30 mg via INTRAVENOUS

## 2022-03-17 MED ORDER — SODIUM CHLORIDE (PF) 0.9 % IJ SOLN
INTRAMUSCULAR | Status: DC | PRN
Start: 2022-03-17 — End: 2022-03-17
  Administered 2022-03-17: 30 mL via INTRAVENOUS

## 2022-03-17 MED ORDER — KETOROLAC TROMETHAMINE 15 MG/ML IJ SOLN
15.0000 mg | Freq: Four times a day (QID) | INTRAMUSCULAR | Status: DC
  Administered 2022-03-17 – 2022-03-18 (×3): 15 mg via INTRAVENOUS
  Filled 2022-03-17 (×3): qty 1

## 2022-03-17 MED ORDER — PROPOFOL 1000 MG/100ML IV EMUL
INTRAVENOUS | Status: AC
  Filled 2022-03-17: qty 100

## 2022-03-17 MED ORDER — SENNA 8.6 MG PO TABS
1.0000 | ORAL_TABLET | Freq: Two times a day (BID) | ORAL | Status: DC
  Administered 2022-03-17 – 2022-03-18 (×2): 8.6 mg via ORAL
  Filled 2022-03-17 (×2): qty 1

## 2022-03-17 MED ORDER — TRANEXAMIC ACID-NACL 1000-0.7 MG/100ML-% IV SOLN
INTRAVENOUS | Status: AC
  Filled 2022-03-17: qty 100

## 2022-03-17 MED ORDER — BUPIVACAINE-EPINEPHRINE 0.25% -1:200000 IJ SOLN
INTRAMUSCULAR | Status: DC | PRN
  Administered 2022-03-17: 30 mL

## 2022-03-17 MED ORDER — ORAL CARE MOUTH RINSE: 15.0000 mL | Freq: Once | OROMUCOSAL | Status: AC

## 2022-03-17 MED ORDER — CEFAZOLIN SODIUM-DEXTROSE 2-4 GM/100ML-% IV SOLN
2.0000 g | Freq: Four times a day (QID) | INTRAVENOUS | Status: AC
  Administered 2022-03-17 (×2): 2 g via INTRAVENOUS
  Filled 2022-03-17 (×2): qty 100

## 2022-03-17 MED ORDER — METHOCARBAMOL 500 MG IVPB - SIMPLE MED
500.0000 mg | Freq: Four times a day (QID) | INTRAVENOUS | Status: DC | PRN
  Filled 2022-03-17: qty 50

## 2022-03-17 MED ORDER — METOCLOPRAMIDE HCL 5 MG/ML IJ SOLN
INTRAMUSCULAR | Status: AC
  Filled 2022-03-17: qty 2

## 2022-03-17 MED ORDER — KETOROLAC TROMETHAMINE 30 MG/ML IJ SOLN
INTRAMUSCULAR | Status: AC
  Filled 2022-03-17: qty 1

## 2022-03-17 MED ORDER — METHOCARBAMOL 500 MG PO TABS: 500.0000 mg | ORAL_TABLET | Freq: Four times a day (QID) | ORAL | 0 refills | Status: AC | PRN

## 2022-03-17 MED ORDER — ONDANSETRON HCL 4 MG/2ML IJ SOLN
INTRAMUSCULAR | Status: DC | PRN
  Administered 2022-03-17: 4 mg via INTRAVENOUS

## 2022-03-17 MED ORDER — CEFAZOLIN SODIUM-DEXTROSE 2-4 GM/100ML-% IV SOLN
INTRAVENOUS | Status: AC
  Filled 2022-03-17: qty 100

## 2022-03-17 MED ORDER — HYDROMORPHONE HCL 1 MG/ML IJ SOLN: 0.2500 mg | INTRAMUSCULAR | Status: DC | PRN

## 2022-03-17 MED ORDER — PHENOL 1.4 % MT LIQD: 1.0000 | OROMUCOSAL | Status: DC | PRN

## 2022-03-17 MED ORDER — MIDAZOLAM HCL 2 MG/2ML IJ SOLN
INTRAMUSCULAR | Status: AC
  Filled 2022-03-17: qty 2

## 2022-03-17 MED ORDER — POLYETHYLENE GLYCOL 3350 17 G PO PACK: 17.0000 g | PACK | Freq: Every day | ORAL | Status: DC | PRN

## 2022-03-17 MED ORDER — CHLORHEXIDINE GLUCONATE 0.12 % MT SOLN
15.0000 mL | Freq: Once | OROMUCOSAL | Status: AC
  Administered 2022-03-17: 15 mL via OROMUCOSAL

## 2022-03-17 MED ORDER — MENTHOL 3 MG MT LOZG: 1.0000 | LOZENGE | OROMUCOSAL | Status: DC | PRN

## 2022-03-17 MED ORDER — ACETAMINOPHEN 500 MG PO TABS
ORAL_TABLET | ORAL | Status: AC
  Administered 2022-03-17: 1000 mg via ORAL
  Filled 2022-03-17: qty 2

## 2022-03-17 MED ORDER — ACETAMINOPHEN 325 MG PO TABS
325.0000 mg | ORAL_TABLET | Freq: Four times a day (QID) | ORAL | Status: DC | PRN
  Administered 2022-03-18: 650 mg via ORAL
  Filled 2022-03-17: qty 2

## 2022-03-17 MED ORDER — FENTANYL CITRATE (PF) 100 MCG/2ML IJ SOLN
INTRAMUSCULAR | Status: DC | PRN
  Administered 2022-03-17: 100 ug via INTRAVENOUS

## 2022-03-17 MED ORDER — PROPOFOL 10 MG/ML IV BOLUS
INTRAVENOUS | Status: AC
  Filled 2022-03-17: qty 20

## 2022-03-17 MED ORDER — METOCLOPRAMIDE HCL 5 MG PO TABS
5.0000 mg | ORAL_TABLET | Freq: Three times a day (TID) | ORAL | Status: DC | PRN
  Filled 2022-03-17: qty 2

## 2022-03-17 MED ORDER — DEXAMETHASONE SODIUM PHOSPHATE 10 MG/ML IJ SOLN
INTRAMUSCULAR | Status: DC | PRN
  Administered 2022-03-17: 8 mg via INTRAVENOUS

## 2022-03-17 MED ORDER — LACTATED RINGERS IV SOLN: INTRAVENOUS | Status: DC

## 2022-03-17 MED ORDER — PROCHLORPERAZINE EDISYLATE 10 MG/2ML IJ SOLN
10.0000 mg | Freq: Four times a day (QID) | INTRAMUSCULAR | Status: DC | PRN
  Administered 2022-03-17: 10 mg via INTRAVENOUS

## 2022-03-17 MED ORDER — POVIDONE-IODINE 10 % EX SWAB: 2.0000 "application " | Freq: Once | CUTANEOUS | Status: DC

## 2022-03-17 MED ORDER — MORPHINE SULFATE (PF) 2 MG/ML IV SOLN: 0.5000 mg | INTRAVENOUS | Status: DC | PRN

## 2022-03-17 MED ORDER — LACTATED RINGERS IV BOLUS
500.0000 mL | Freq: Once | INTRAVENOUS | Status: AC
  Administered 2022-03-17: 500 mL via INTRAVENOUS

## 2022-03-17 MED ORDER — SODIUM CHLORIDE 0.9 % IR SOLN
Status: DC | PRN
  Administered 2022-03-17: 1000 mL

## 2022-03-17 MED ORDER — DOCUSATE SODIUM 100 MG PO CAPS
100.0000 mg | ORAL_CAPSULE | Freq: Two times a day (BID) | ORAL | Status: DC
  Administered 2022-03-17 – 2022-03-18 (×2): 100 mg via ORAL
  Filled 2022-03-17 (×2): qty 1

## 2022-03-17 MED ORDER — BUPIVACAINE IN DEXTROSE 0.75-8.25 % IT SOLN
INTRATHECAL | Status: DC | PRN
  Administered 2022-03-17: 2 mL via INTRATHECAL

## 2022-03-17 MED ORDER — POLYETHYLENE GLYCOL 3350 17 G PO PACK: 17.0000 g | PACK | Freq: Every day | ORAL | 0 refills | Status: AC | PRN

## 2022-03-17 MED ORDER — HYDROCODONE-ACETAMINOPHEN 5-325 MG PO TABS: 1.0000 | ORAL_TABLET | ORAL | 0 refills | Status: AC | PRN

## 2022-03-17 MED ORDER — HYDROCODONE-ACETAMINOPHEN 7.5-325 MG PO TABS: 1.0000 | ORAL_TABLET | ORAL | Status: DC | PRN

## 2022-03-17 MED ORDER — PHENYLEPHRINE HCL-NACL 20-0.9 MG/250ML-% IV SOLN
INTRAVENOUS | Status: DC | PRN
  Administered 2022-03-17: 20 ug/min via INTRAVENOUS

## 2022-03-17 MED ORDER — SODIUM CHLORIDE (PF) 0.9 % IJ SOLN
INTRAMUSCULAR | Status: AC
  Filled 2022-03-17: qty 50

## 2022-03-17 MED ORDER — SODIUM CHLORIDE 0.9 % IV SOLN: INTRAVENOUS | Status: DC

## 2022-03-17 MED ORDER — DEXAMETHASONE SODIUM PHOSPHATE 10 MG/ML IJ SOLN
INTRAMUSCULAR | Status: AC
  Filled 2022-03-17: qty 1

## 2022-03-17 MED ORDER — FENTANYL CITRATE (PF) 100 MCG/2ML IJ SOLN
INTRAMUSCULAR | Status: AC
  Filled 2022-03-17: qty 2

## 2022-03-17 MED ORDER — ONDANSETRON HCL 4 MG PO TABS
4.0000 mg | ORAL_TABLET | Freq: Four times a day (QID) | ORAL | Status: DC | PRN
Start: 2022-03-17 — End: 2022-03-18
  Filled 2022-03-17: qty 1

## 2022-03-17 MED ORDER — ONDANSETRON HCL 4 MG PO TABS: 4.0000 mg | ORAL_TABLET | Freq: Three times a day (TID) | ORAL | 0 refills | Status: AC | PRN

## 2022-03-17 MED ORDER — TRANEXAMIC ACID-NACL 1000-0.7 MG/100ML-% IV SOLN
1000.0000 mg | INTRAVENOUS | Status: AC
  Administered 2022-03-17: 1000 mg via INTRAVENOUS

## 2022-03-17 MED ORDER — PROPOFOL 500 MG/50ML IV EMUL
INTRAVENOUS | Status: DC | PRN
  Administered 2022-03-17: 75 ug/kg/min via INTRAVENOUS

## 2022-03-17 MED ORDER — METOCLOPRAMIDE HCL 5 MG/ML IJ SOLN
5.0000 mg | Freq: Three times a day (TID) | INTRAMUSCULAR | Status: DC | PRN
  Administered 2022-03-17: 10 mg via INTRAVENOUS

## 2022-03-17 MED ORDER — SENNA 8.6 MG PO TABS: 2.0000 | ORAL_TABLET | Freq: Every day | ORAL | 1 refills | Status: AC

## 2022-03-17 MED ORDER — MIDAZOLAM HCL 2 MG/2ML IJ SOLN
INTRAMUSCULAR | Status: DC | PRN
  Administered 2022-03-17: 2 mg via INTRAVENOUS

## 2022-03-17 MED ORDER — HYDROCODONE-ACETAMINOPHEN 5-325 MG PO TABS
1.0000 | ORAL_TABLET | ORAL | Status: DC | PRN
  Administered 2022-03-17 – 2022-03-18 (×2): 2 via ORAL
  Filled 2022-03-17 (×2): qty 2

## 2022-03-17 MED ORDER — BUPIVACAINE-EPINEPHRINE (PF) 0.25% -1:200000 IJ SOLN
INTRAMUSCULAR | Status: AC
  Filled 2022-03-17: qty 30

## 2022-03-17 MED ORDER — ISOPROPYL ALCOHOL 70 % SOLN
Status: DC | PRN
  Administered 2022-03-17: 1 via TOPICAL

## 2022-03-17 MED ORDER — DIPHENHYDRAMINE HCL 12.5 MG/5ML PO ELIX: 12.5000 mg | ORAL_SOLUTION | ORAL | Status: DC | PRN

## 2022-03-17 MED ORDER — DOCUSATE SODIUM 100 MG PO CAPS: 100.0000 mg | ORAL_CAPSULE | Freq: Two times a day (BID) | ORAL | 1 refills | Status: AC

## 2022-03-17 MED ORDER — POVIDONE-IODINE 10 % EX SWAB
2.0000 "application " | Freq: Once | CUTANEOUS | Status: AC
  Administered 2022-03-17: 2 via TOPICAL

## 2022-03-17 MED ORDER — LIDOCAINE 2% (20 MG/ML) 5 ML SYRINGE
INTRAMUSCULAR | Status: DC | PRN
  Administered 2022-03-17: 60 mg via INTRAVENOUS

## 2022-03-17 MED ORDER — FAMOTIDINE 40 MG PO TABS: 40.0000 mg | ORAL_TABLET | Freq: Every evening | ORAL | 2 refills | Status: AC

## 2022-03-17 MED ORDER — PROCHLORPERAZINE EDISYLATE 10 MG/2ML IJ SOLN
INTRAMUSCULAR | Status: AC
  Filled 2022-03-17: qty 2

## 2022-03-17 MED ORDER — ASPIRIN 81 MG PO CHEW: 81.0000 mg | CHEWABLE_TABLET | Freq: Two times a day (BID) | ORAL | 0 refills | Status: AC

## 2022-03-17 SURGICAL SUPPLY — 54 items
BAG COUNTER SPONGE SURGICOUNT (BAG) IMPLANT
BAG DECANTER FOR FLEXI CONT (MISCELLANEOUS) IMPLANT
BAG ZIPLOCK 12X15 (MISCELLANEOUS) IMPLANT
CHLORAPREP W/TINT 26 (MISCELLANEOUS) ×2 IMPLANT
COVER PERINEAL POST (MISCELLANEOUS) ×2 IMPLANT
COVER SURGICAL LIGHT HANDLE (MISCELLANEOUS) ×2 IMPLANT
DERMABOND ADVANCED (GAUZE/BANDAGES/DRESSINGS) ×1
DERMABOND ADVANCED .7 DNX12 (GAUZE/BANDAGES/DRESSINGS) ×2 IMPLANT
DRAPE IMP U-DRAPE 54X76 (DRAPES) ×2 IMPLANT
DRAPE SHEET LG 3/4 BI-LAMINATE (DRAPES) ×6 IMPLANT
DRAPE STERI IOBAN 125X83 (DRAPES) ×2 IMPLANT
DRAPE U-SHAPE 47X51 STRL (DRAPES) ×4 IMPLANT
DRESSING AQUACEL AG SP 3.5X10 (GAUZE/BANDAGES/DRESSINGS) IMPLANT
DRSG AQUACEL AG ADV 3.5X10 (GAUZE/BANDAGES/DRESSINGS) ×2 IMPLANT
DRSG AQUACEL AG SP 3.5X10 (GAUZE/BANDAGES/DRESSINGS) ×2
ELECT REM PT RETURN 15FT ADLT (MISCELLANEOUS) ×2 IMPLANT
GAUZE SPONGE 4X4 12PLY STRL (GAUZE/BANDAGES/DRESSINGS) ×2 IMPLANT
GLOVE SURG ENC MOIS LTX SZ8.5 (GLOVE) ×4 IMPLANT
GLOVE SURG UNDER POLY LF SZ8.5 (GLOVE) ×2 IMPLANT
GOWN SPEC L3 XXLG W/TWL (GOWN DISPOSABLE) ×4 IMPLANT
HANDPIECE INTERPULSE COAX TIP (DISPOSABLE) ×2
HEAD FEM 32X0 DELTA TI (Head) ×1 IMPLANT
HOLDER FOLEY CATH W/STRAP (MISCELLANEOUS) ×2 IMPLANT
HOOD PEEL AWAY FLYTE STAYCOOL (MISCELLANEOUS) ×6 IMPLANT
KIT TURNOVER KIT A (KITS) IMPLANT
LINER G7 VIT E NTRL 32 SZD HIP (Liner) ×1 IMPLANT
MANIFOLD NEPTUNE II (INSTRUMENTS) ×2 IMPLANT
MARKER SKIN DUAL TIP RULER LAB (MISCELLANEOUS) ×2 IMPLANT
NDL SAFETY ECLIPSE 18X1.5 (NEEDLE) ×1 IMPLANT
NDL SPNL 18GX3.5 QUINCKE PK (NEEDLE) ×1 IMPLANT
NEEDLE HYPO 18GX1.5 SHARP (NEEDLE) ×2
NEEDLE SPNL 18GX3.5 QUINCKE PK (NEEDLE) ×2 IMPLANT
PACK ANTERIOR HIP CUSTOM (KITS) ×2 IMPLANT
PENCIL SMOKE EVACUATOR (MISCELLANEOUS) IMPLANT
SAW OSC TIP CART 19.5X105X1.3 (SAW) ×2 IMPLANT
SEALER BIPOLAR AQUA 6.0 (INSTRUMENTS) ×2 IMPLANT
SET HNDPC FAN SPRY TIP SCT (DISPOSABLE) ×1 IMPLANT
SHELL ACET G7 3H 50 SZD (Shell) ×1 IMPLANT
SOLUTION PRONTOSAN WOUND 350ML (IRRIGATION / IRRIGATOR) ×2 IMPLANT
SPIKE FLUID TRANSFER (MISCELLANEOUS) ×2 IMPLANT
SPONGE T-LAP 18X18 ~~LOC~~+RFID (SPONGE) ×6 IMPLANT
STAPLER INSORB 30 2030 C-SECTI (MISCELLANEOUS) IMPLANT
STEM FEMORAL TAPERLOC 13X111 (Stem) ×1 IMPLANT
SUT MNCRL AB 3-0 PS2 18 (SUTURE) ×2 IMPLANT
SUT MON AB 2-0 CT1 36 (SUTURE) ×2 IMPLANT
SUT STRATAFIX PDO 1 14 VIOLET (SUTURE) ×2
SUT STRATFX PDO 1 14 VIOLET (SUTURE) ×1
SUT VIC AB 2-0 CT1 27 (SUTURE)
SUT VIC AB 2-0 CT1 TAPERPNT 27 (SUTURE) IMPLANT
SUTURE STRATFX PDO 1 14 VIOLET (SUTURE) ×1 IMPLANT
SYR 3ML LL SCALE MARK (SYRINGE) ×2 IMPLANT
TRAY FOLEY MTR SLVR 16FR STAT (SET/KITS/TRAYS/PACK) IMPLANT
TUBE SUCTION HIGH CAP CLEAR NV (SUCTIONS) ×2 IMPLANT
WATER STERILE IRR 1000ML POUR (IV SOLUTION) ×2 IMPLANT

## 2022-03-17 NOTE — Anesthesia Procedure Notes (Signed)
Spinal ? ?Patient location during procedure: OR ?Start time: 03/17/2022 7:46 AM ?End time: 03/17/2022 7:48 AM ?Reason for block: surgical anesthesia ?Staffing ?Performed: anesthesiologist  ?Anesthesiologist: Roderic Palau, MD ?Preanesthetic Checklist ?Completed: patient identified, IV checked, risks and benefits discussed, surgical consent, monitors and equipment checked, pre-op evaluation and timeout performed ?Spinal Block ?Patient position: sitting ?Prep: DuraPrep ?Patient monitoring: cardiac monitor, continuous pulse ox and blood pressure ?Approach: midline ?Location: L3-4 ?Injection technique: single-shot ?Needle ?Needle type: Pencan  ?Needle gauge: 24 G ?Needle length: 9 cm ?Assessment ?Sensory level: T8 ?Events: CSF return and second provider ?Additional Notes ?Functioning IV was confirmed and monitors were applied. Sterile prep and drape, including hand hygiene and sterile gloves were used. The patient was positioned and the spine was prepped. The skin was anesthetized with lidocaine.  Free flow of clear CSF was obtained prior to injecting local anesthetic into the CSF.  The spinal needle aspirated freely following injection.  The needle was carefully withdrawn.  The patient tolerated the procedure well. CRNA attempted at L4-5 prior to my attempt. ? ? ? ?

## 2022-03-17 NOTE — Plan of Care (Signed)

## 2022-03-17 NOTE — Anesthesia Postprocedure Evaluation (Signed)
Anesthesia Post Note ? ?Patient: Meghan Burgess ? ?Procedure(s) Performed: TOTAL HIP ARTHROPLASTY ANTERIOR APPROACH (Right: Hip) ? ?  ? ?Patient location during evaluation: PACU ?Anesthesia Type: Spinal ?Level of consciousness: oriented and awake and alert ?Pain management: pain level controlled ?Vital Signs Assessment: post-procedure vital signs reviewed and stable ?Respiratory status: spontaneous breathing and respiratory function stable ?Cardiovascular status: blood pressure returned to baseline and stable ?Postop Assessment: no headache, no backache, no apparent nausea or vomiting, spinal receding and patient able to bend at knees ?Anesthetic complications: no ? ? ?No notable events documented. ? ?Last Vitals:  ?Vitals:  ? 03/17/22 1144 03/17/22 1200  ?BP: (!) 143/88 134/81  ?Pulse: 60 (!) 58  ?Resp: 18   ?Temp:    ?SpO2: 100% 97%  ?  ?Last Pain:  ?Vitals:  ? 03/17/22 1200  ?TempSrc:   ?PainSc: 0-No pain  ? ? ?  ?  ?  ?  ?  ?  ? ?Merrin Mcvicker,W. EDMOND ? ? ? ? ?

## 2022-03-17 NOTE — Progress Notes (Signed)
Patient still experiencing some numbness post spinal anesthesia, unable to successfully complete physical therapy session. Dr. Lyla Glassing aware, pt will be admitted overnight for observation.  Coolidge Breeze, RN 03/17/2022 ? ?

## 2022-03-17 NOTE — Discharge Instructions (Signed)
? ?Dr. Yides Saidi ?Joint Replacement Specialist ?[facility] ?[address] ?[place], [place] [address] ?[phone] ? ? ?TOTAL HIP REPLACEMENT POSTOPERATIVE DIRECTIONS ? ? ? ?Hip Rehabilitation, Guidelines Following Surgery  ? ?WEIGHT BEARING ?Weight bearing as tolerated with assist device (walker, cane, etc) as directed, use it as long as suggested by your surgeon or therapist, typically at least 4-6 weeks. ? ?The results of a hip operation are greatly improved after range of motion and muscle strengthening exercises. Follow all safety measures which are given to protect your hip. If any of these exercises cause increased pain or swelling in your joint, decrease the amount until you are comfortable again. Then slowly increase the exercises. Call your caregiver if you have problems or questions.  ? ?HOME CARE INSTRUCTIONS  ?Most of the following instructions are designed to prevent the dislocation of your new hip.  ?Remove items at home which could result in a fall. This includes throw rugs or furniture in walking pathways.  ?Continue medications as instructed at time of discharge. ?You may have some home medications which will be placed on hold until you complete the course of blood thinner medication. ?You may start showering once you are discharged home. Do not remove your dressing. ?Do not put on socks or shoes without following the instructions of your caregivers.   ?Sit on chairs with arms. Use the chair arms to help push yourself up when arising.  ?Arrange for the use of a toilet seat elevator so you are not sitting low.  ?Walk with walker as instructed.  ?You may resume a sexual relationship in one month or when given the OK by your caregiver.  ?Use walker as long as suggested by your caregivers.  ?You may put full weight on your legs and walk as much as is comfortable. ?Avoid periods of inactivity such as sitting longer than an hour when not asleep. This helps prevent blood  clots.  ?You may return to work once you are cleared by your surgeon.  ?Do not drive a car for 6 weeks or until released by your surgeon.  ?Do not drive while taking narcotics.  ?Wear elastic stockings for two weeks following surgery during the day but you may remove then at night.  ?Make sure you keep all of your appointments after your operation with all of your doctors and caregivers. You should call the office at the above phone number and make an appointment for approximately two weeks after the date of your surgery. ?Please pick up a stool softener and laxative for home use as long as you are requiring pain medications. ?ICE to the affected hip every three hours for 30 minutes at a time and then as needed for pain and swelling. Continue to use ice on the hip for pain and swelling from surgery. You may notice swelling that will progress down to the foot and ankle.  This is normal after surgery.  Elevate the leg when you are not up walking on it.   ?It is important for you to complete the blood thinner medication as prescribed by your doctor. ?Continue to use the breathing machine which will help keep your temperature down.  It is common for your temperature to cycle up and down following surgery, especially at night when you are not up moving around and exerting yourself.  The breathing machine keeps your lungs expanded and your temperature down. ? ?RANGE OF MOTION AND STRENGTHENING EXERCISES  ?These exercises are designed to help you   keep full movement of your hip joint. Follow your caregiver's or physical therapist's instructions. Perform all exercises about fifteen times, three times per day or as directed. Exercise both hips, even if you have had only one joint replacement. These exercises can be done on a training (exercise) mat, on the floor, on a table or on a bed. Use whatever works the best and is most comfortable for you. Use music or television while you are exercising so that the exercises are a  pleasant break in your day. This will make your life better with the exercises acting as a break in routine you can look forward to.  ?Lying on your back, slowly slide your foot toward your buttocks, raising your knee up off the floor. Then slowly slide your foot back down until your leg is straight again.  ?Lying on your back spread your legs as far apart as you can without causing discomfort.  ?Lying on your side, raise your upper leg and foot straight up from the floor as far as is comfortable. Slowly lower the leg and repeat.  ?Lying on your back, tighten up the muscle in the front of your thigh (quadriceps muscles). You can do this by keeping your leg straight and trying to raise your heel off the floor. This helps strengthen the largest muscle supporting your knee.  ?Lying on your back, tighten up the muscles of your buttocks both with the legs straight and with the knee bent at a comfortable angle while keeping your heel on the floor.  ? ?SKILLED REHAB INSTRUCTIONS: ?If the patient is transferred to a skilled rehab facility following release from the hospital, a list of the current medications will be sent to the facility for the patient to continue.  When discharged from the skilled rehab facility, please have the facility set up the patient's Home Health Physical Therapy prior to being released. Also, the skilled facility will be responsible for providing the patient with their medications at time of release from the facility to include their pain medication and their blood thinner medication. If the patient is still at the rehab facility at time of the two week follow up appointment, the skilled rehab facility will also need to assist the patient in arranging follow up appointment in our office and any transportation needs. ? ?POST-OPERATIVE OPIOID TAPER INSTRUCTIONS: ?It is important to wean off of your opioid medication as soon as possible. If you do not need pain medication after your surgery it is ok  to stop day one. ?Opioids include: ?Codeine, Hydrocodone(Norco, Vicodin), Oxycodone(Percocet, oxycontin) and hydromorphone amongst others.  ?Long term and even short term use of opiods can cause: ?Increased pain response ?Dependence ?Constipation ?Depression ?Respiratory depression ?And more.  ?Withdrawal symptoms can include ?Flu like symptoms ?Nausea, vomiting ?And more ?Techniques to manage these symptoms ?Hydrate well ?Eat regular healthy meals ?Stay active ?Use relaxation techniques(deep breathing, meditating, yoga) ?Do Not substitute Alcohol to help with tapering ?If you have been on opioids for less than two weeks and do not have pain than it is ok to stop all together.  ?Plan to wean off of opioids ?This plan should start within one week post op of your joint replacement. ?Maintain the same interval or time between taking each dose and first decrease the dose.  ?Cut the total daily intake of opioids by one tablet each day ?Next start to increase the time between doses. ?The last dose that should be eliminated is the evening dose.  ? ? ?MAKE   SURE YOU:  ?Understand these instructions.  ?Will watch your condition.  ?Will get help right away if you are not doing well or get worse. ? ?Pick up stool softner and laxative for home use following surgery while on pain medications. ?Do not remove your dressing. ?The dressing is waterproof--it is OK to take showers. ?Continue to use ice for pain and swelling after surgery. ?Do not use any lotions or creams on the incision until instructed by your surgeon. ?Total Hip Protocol. ? ?

## 2022-03-17 NOTE — Op Note (Signed)
OPERATIVE REPORT ? ?SURGEON: Rod Can, MD  ? ?ASSISTANT: Nehemiah Massed, PA-C ?Larene Pickett, PA-C ? ?PREOPERATIVE DIAGNOSIS: Right hip arthritis.  ? ?POSTOPERATIVE DIAGNOSIS: Right hip arthritis.  ? ?PROCEDURE: Right total hip arthroplasty, anterior approach.  ? ?IMPLANTS: Biomet Taperloc Complete Microplasty stem, size 13 x 111 mm, high offset. ?Biomet G7 OsseoTi Cup, size 50 mm. ?Biomet Vivacit-E liner, size 32 mm, D, neutral. ?Biomet Biolox ceramic head ball, size 32 + 0 mm. ? ?ANESTHESIA:  MAC and Spinal ? ?ESTIMATED BLOOD LOSS:-300 mL   ? ?ANTIBIOTICS: 2g Ancef. ? ?DRAINS: None. ? ?COMPLICATIONS: None. ?  ?CONDITION: PACU - hemodynamically stable.  ? ?BRIEF CLINICAL NOTE: Meghan Burgess is a 51 y.o. female with a long-standing history of Right hip arthritis. After failing conservative management, the patient was indicated for total hip arthroplasty. The risks, benefits, and alternatives to the procedure were explained, and the patient elected to proceed. ? ?PROCEDURE IN DETAIL: Surgical site was marked by myself in the pre-op holding area. Once inside the operating room, spinal anesthesia was obtained, and a foley catheter was inserted. The patient was then positioned on the Hana table.  All bony prominences were well padded.  The hip was prepped and draped in the normal sterile surgical fashion.  A time-out was called verifying side and site of surgery. The patient received IV antibiotics within 60 minutes of beginning the procedure. ?  ?Bikini incision was made, and superficial dissection was performed lateral to the ASIS. The direct anterior approach to the hip was performed through the Hueter interval.  Lateral femoral circumflex vessels were treated with the Auqumantys. The anterior capsule was exposed and an inverted T capsulotomy was made. The femoral neck cut was made to the level of the templated cut.  A corkscrew was placed into the head and the head was removed.  The femoral head was  found to have eburnated bone. The head was passed to the back table and was measured. Pubofemoral ligament was released off of the calcar, taking care to stay on bone. Superior capsule was released from the greater trochanter, taking care to stay lateral to the posterior border of the femoral neck in order to preserve the short external rotators. ?  ?Acetabular exposure was achieved, and the pulvinar and labrum were excised. Sequential reaming of the acetabulum was then performed up to a size 49 mm reamer. A 50 mm cup was then opened and impacted into place at approximately 40 degrees of abduction and 20 degrees of anteversion. The final polyethylene liner was impacted into place and acetabular osteophytes were removed.  ?  ?I then gained femoral exposure taking care to protect the abductors and greater trochanter.  This was performed using standard external rotation, extension, and adduction.  A cookie cutter was used to enter the femoral canal, and then the femoral canal finder was placed.  Sequential broaching was performed up to a size 13.  Calcar planer was used on the femoral neck remnant.  I placed a high offset neck and a trial head ball.  The hip was reduced.  Leg lengths and offset were checked fluoroscopically.  The hip was dislocated and trial components were removed.  The final implants were placed, and the hip was reduced.  Fluoroscopy was used to confirm component position and leg lengths.  At 90 degrees of external rotation and full extension, the hip was stable to an anterior directed force. ?  ?The wound was copiously irrigated with Prontosan solution and normal saline using  pule lavage.  Marcaine solution was injected into the periarticular soft tissue.  The wound was closed in layers using #1 Vicryl and V-Loc for the fascia, 2-0 Vicryl for the subcutaneous fat, 2-0 Monocryl for the deep dermal layer, 3-0 running Monocryl subcuticular stitch, and Dermabond for the skin.  Once the glue was fully  dried, an Aquacell Ag dressing was applied.  The patient was transported to the recovery room in stable condition.  Sponge, needle, and instrument counts were correct at the end of the case x2.  The patient tolerated the procedure well and there were no known complications. ? ?Please note that a surgical assistant was a medical necessity for this procedure to perform it in a safe and expeditious manner. Assistant was necessary to provide appropriate retraction of vital neurovascular structures, to prevent femoral fracture, and to allow for anatomic placement of the prosthesis. ?

## 2022-03-17 NOTE — Progress Notes (Signed)
Pt has arrived to 1335 from PACU s/p right total hip arthroplasty.  Pt is alert and oriented.  Husband at bedside.  Room orientation completed with call bell placed at bedside.  Will continue to monitor pt. ?

## 2022-03-17 NOTE — Interval H&P Note (Signed)
History and Physical Interval Note: ? ?03/17/2022 ?7:17 AM ? ?Meghan Burgess  has presented today for surgery, with the diagnosis of Right hip osteoarthritis.  The various methods of treatment have been discussed with the patient and family. After consideration of risks, benefits and other options for treatment, the patient has consented to  Procedure(s): ?TOTAL HIP ARTHROPLASTY ANTERIOR APPROACH (Right) as a surgical intervention.  The patient's history has been reviewed, patient examined, no change in status, stable for surgery.  I have reviewed the patient's chart and labs.  Questions were answered to the patient's satisfaction.   ? ? ?[name] ? ? ?

## 2022-03-17 NOTE — Transfer of Care (Signed)
Immediate Anesthesia Transfer of Care Note ? ?Patient: Meghan Burgess ? ?Procedure(s) Performed: TOTAL HIP ARTHROPLASTY ANTERIOR APPROACH (Right: Hip) ? ?Patient Location: PACU ? ?Anesthesia Type:Spinal ? ?Level of Consciousness: awake, drowsy and patient cooperative ? ?Airway & Oxygen Therapy: Patient Spontanous Breathing and Patient connected to face mask oxygen ? ?Post-op Assessment: Report given to RN and Post -op Vital signs reviewed and stable ? ?Post vital signs: Reviewed and stable ? ?Last Vitals:  ?Vitals Value Taken Time  ?BP 119/71 03/17/22 1045  ?Temp    ?Pulse 83 03/17/22 1047  ?Resp 21 03/17/22 1047  ?SpO2 100 % 03/17/22 1047  ?Vitals shown include unvalidated device data. ? ?Last Pain:  ?Vitals:  ? 03/17/22 0610  ?TempSrc:   ?PainSc: 0-No pain  ?   ? ?  ? ?Complications: No notable events documented. ?

## 2022-03-17 NOTE — Anesthesia Procedure Notes (Signed)
Procedure Name: [place] ?Date/Time: 03/17/2022 9:43 AM ?Performed by: Eben Burow, CRNA ?Pre-anesthesia Checklist: Patient identified, Emergency Drugs available, Suction available, Patient being monitored and Timeout performed ?Oxygen Delivery Method: Simple face mask ?Placement Confirmation: positive ETCO2 ? ? ? ? ?

## 2022-03-17 NOTE — Evaluation (Signed)
Physical Therapy Evaluation Patient Details Name: Meghan Burgess MRN: [id number] DOB: [birth year] Today's Date: 03/17/2022  History of Present Illness  Pt is a 51yo female presenting s/p R-THA, anterior approach on 03/17/22. PMH: cervical cancer, GERD, OA.  Clinical Impression  Meghan Burgess is a 51 y.o. female POD 0 s/p R-THA, anterior approach. Patient reports independence with mobility at baseline. Patient is now limited by functional impairments (see PT problem list below) and requires mod assist for transfers. Pt still experiencing some numbness, incontinence, nausea, and vomiting post-spinal anesthesia. Full evaluation was not completed secondary to the listed symptoms. Feel pt will progress well once stabilized. Patient instructed in exercises to facilitate ROM and circulation. Patient will benefit from continued skilled PT interventions to address impairments and progress towards PLOF. We will continue to follow acutely to progress towards Mod I goals.       Recommendations for follow up therapy are one component of a multi-disciplinary discharge planning process, led by the attending physician.  Recommendations may be updated based on patient status, additional functional criteria and insurance authorization.  Follow Up Recommendations Follow physician's recommendations for discharge plan and follow up therapies    Assistance Recommended at Discharge Set up Supervision/Assistance  Patient can return home with the following  A little help with walking and/or transfers;A little help with bathing/dressing/bathroom;Assistance with cooking/housework;Assist for transportation;Help with stairs or ramp for entrance    Equipment Recommendations None recommended by PT (pt has recommended DME)  Recommendations for Other Services       Functional Status Assessment Patient has had a recent decline in their functional status and demonstrates the ability to make significant improvements in function  in a reasonable and predictable amount of time.     Precautions / Restrictions Precautions Precautions: Fall Restrictions Weight Bearing Restrictions: No Other Position/Activity Restrictions: WBAT      Mobility  Bed Mobility Overal bed mobility: Needs Assistance Bed Mobility: Supine to Sit, Sit to Supine     Supine to sit: Min guard Sit to supine: Mod assist   General bed mobility comments: Pt min guard for supine to sit for safety only, no physical assist required. Sit to supine: pt required mod assist for bringing RLE back onto stretcher and safely lowering trunk down to supine secondary to nausea and dizziness (see transfers).    Transfers Overall transfer level: Needs assistance Equipment used: Rolling walker (2 wheels) Transfers: Sit to/from Stand Sit to Stand: Mod assist, Min assist           General transfer comment: Pt required min-mod assist for steadying of trunk and RW during sit to stand transfer. Upon standing pt demonstrated very wide BOS (limited by walker posts), bilateral knee hyperextension, and anterior lean. Pt unable to self-correct. Additionally, pt found to be incontinent upon standing. After ~60s standing, pt attempted to march in place and was unable to weight shift safely without PT mod assist to correct into upright standing. At this point, pt reported nausea and dizziness so returned to sitting on stretcher with mod assist for controlled lowering and hips scooting into safe seated position with legs dangling. Pt remained in this position for ~24min but started to appear pale, cool, and clammy and was returned to supine positioning and immediately vomited.    Ambulation/Gait               General Gait Details: deferred  Careers information officer  Modified Rankin (Stroke Patients Only)       Balance Overall balance assessment: Needs assistance Sitting-balance support: Feet unsupported, No upper extremity  supported Sitting balance-Leahy Scale: Poor     Standing balance support: Reliant on assistive device for balance, During functional activity, Bilateral upper extremity supported Standing balance-Leahy Scale: Poor Standing balance comment: Pt reliant on BUE support on RW during static stance and demonstrated anterior lean, unable to weight shift for dynamic balance.                             Pertinent Vitals/Pain Pain Assessment Pain Assessment: 0-10 Pain Score: 1  Pain Location: R hip Pain Descriptors / Indicators: Operative site guarding Pain Intervention(s): Monitored during session, Repositioned    Home Living Family/patient expects to be discharged to:: Private residence Living Arrangements: Spouse/significant other (Husband Meghan Burgess) Available Help at Discharge: Family;Available 24 hours/day Type of Home: House Home Access: Stairs to enter Entrance Stairs-Rails: Can reach both;Left;Right Entrance Stairs-Number of Steps: 5   Home Layout: One level;Laundry or work area in [employer] Equipment: Agricultural consultant (2 wheels)      Prior Function Prior Level of Function : Independent/Modified Independent             Mobility Comments: IND ADLs Comments: IND     Hand Dominance        Extremity/Trunk Assessment   Upper Extremity Assessment Upper Extremity Assessment: Overall WFL for tasks assessed    Lower Extremity Assessment Lower Extremity Assessment: RLE deficits/detail;LLE deficits/detail RLE Deficits / Details: MMT: ank df/pf 5/5, able to complete quad set exercise. Pt reports decreased sensation in the buttocks, generall L2-L3. RLE Sensation: decreased light touch LLE Deficits / Details: MMT ank df/pf 5/5 LLE Sensation: WNL    Cervical / Trunk Assessment Cervical / Trunk Assessment: Normal  Communication   Communication: No difficulties  Cognition Arousal/Alertness: Awake/alert Behavior During Therapy: WFL for tasks  assessed/performed Overall Cognitive Status: Within Functional Limits for tasks assessed                                          General Comments General comments (skin integrity, edema, etc.): VSS but increased post-emesis (see vitals flowsheet)    Exercises Total Joint Exercises Ankle Circles/Pumps: AROM, 5 reps, Both   Assessment/Plan    PT Assessment Patient needs continued PT services  PT Problem List Decreased strength;Decreased range of motion;Decreased activity tolerance;Decreased balance;Decreased mobility;Decreased coordination;Decreased knowledge of use of DME;Pain;Decreased knowledge of precautions;Decreased safety awareness       PT Treatment Interventions DME instruction;Gait training;Stair training;Functional mobility training;Therapeutic activities;Therapeutic exercise;Balance training;Neuromuscular re-education;Patient/family education    PT Goals (Current goals can be found in the Care Plan section)  Acute Rehab PT Goals Patient Stated Goal: to go home PT Goal Formulation: With patient Time For Goal Achievement: 03/24/22 Potential to Achieve Goals: Good    Frequency 7X/week     Co-evaluation               AM-PAC PT "6 Clicks" Mobility  Outcome Measure Help needed turning from your back to your side while in a flat bed without using bedrails?: A Little Help needed moving from lying on your back to sitting on the side of a flat bed without using bedrails?: A Little Help needed moving to and from a bed to a chair (including a  wheelchair)?: A Little Help needed standing up from a chair using your arms (e.g., wheelchair or bedside chair)?: A Little Help needed to walk in hospital room?: A Little Help needed climbing 3-5 steps with a railing? : A Lot 6 Click Score: 17    End of Session Equipment Utilized During Treatment: Gait belt Activity Tolerance: Other (comment) (Pt limited by nerve blocking still in effect as well as nausea and  vomiting) Patient left: in bed;with call bell/phone within reach;with nursing/sitter in room Nurse Communication: Mobility status PT Visit Diagnosis: Pain;Difficulty in walking, not elsewhere classified (R26.2) Pain - Right/Left: Right Pain - part of body: Hip    Time: 4132-4401 PT Time Calculation (min) (ACUTE ONLY): 27 min   Charges:   PT Evaluation $PT Eval Low Complexity: 1 Low PT Treatments $Therapeutic Activity: 8-22 mins      Jamesetta Geralds, PT, DPT [facility] Rehabilitation Department Office: [phone] Pager: [phone]  Jamesetta Geralds 03/17/2022, 2:51 PM

## 2022-03-18 ENCOUNTER — Encounter (HOSPITAL_COMMUNITY): Payer: Self-pay | Admitting: Orthopedic Surgery

## 2022-03-18 DIAGNOSIS — M1611 Unilateral primary osteoarthritis, right hip: Secondary | ICD-10-CM | POA: Diagnosis not present

## 2022-03-18 LAB — CBC
HCT: 33.6 % — ABNORMAL LOW (ref 36.0–46.0)
Hemoglobin: 11.6 g/dL — ABNORMAL LOW (ref 12.0–15.0)
MCH: 30.4 pg (ref 26.0–34.0)
MCHC: 34.5 g/dL (ref 30.0–36.0)
MCV: 88 fL (ref 80.0–100.0)
Platelets: 223 10*3/uL (ref 150–400)
RBC: 3.82 MIL/uL — ABNORMAL LOW (ref 3.87–5.11)
RDW: 12 % (ref 11.5–15.5)
WBC: 11.3 10*3/uL — ABNORMAL HIGH (ref 4.0–10.5)
nRBC: 0 % (ref 0.0–0.2)

## 2022-03-18 LAB — BASIC METABOLIC PANEL
Anion gap: 6 (ref 5–15)
BUN: 11 mg/dL (ref 6–20)
CO2: 24 mmol/L (ref 22–32)
Calcium: 8.4 mg/dL — ABNORMAL LOW (ref 8.9–10.3)
Chloride: 106 mmol/L (ref 98–111)
Creatinine, Ser: 0.68 mg/dL (ref 0.44–1.00)
GFR, Estimated: >60 mL/min (ref >60)
Glucose, Bld: 114 mg/dL — ABNORMAL HIGH (ref 70–99)
Potassium: 4.1 mmol/L (ref 3.5–5.1)
Sodium: 136 mmol/L (ref 135–145)

## 2022-03-18 NOTE — Progress Notes (Signed)
? ? ?  Subjective: ? ?Patient reports pain as mild. Patient had overnight stay due to N/V. Denies N/V/CP/SOB today. No c/o. ? ?Objective:  ? ?VITALS:   ?Vitals:  ? 03/17/22 2106 03/18/22 0113 03/18/22 0631 03/18/22 0950  ?BP: 131/75 130/81 134/79 123/81  ?Pulse: 84 72 76 86  ?Resp: '17 17 17 20  '$ ?Temp: 98.3 ?F (36.8 ?C) 98.6 ?F (37 ?C) 98.8 ?F (37.1 ?C) 97.9 ?F (36.6 ?C)  ?TempSrc: Oral Oral Oral Oral  ?SpO2: 95% 98% 97% 99%  ?Weight:      ?Height:      ? ? ?NAD ?Neurologically intact ?ABD soft ?Neurovascular intact ?Sensation intact distally ?Intact pulses distally ?Dorsiflexion/Plantar flexion intact ?Incision: dressing C/D/I ?No cellulitis present ?Compartment soft ? ? ?Lab Results  ?Component Value Date  ? WBC 11.3 (H) 03/18/2022  ? HGB 11.6 (L) 03/18/2022  ? HCT 33.6 (L) 03/18/2022  ? MCV 88.0 03/18/2022  ? PLT 223 03/18/2022  ? ?BMET ?   ?Component Value Date/Time  ? NA 136 03/18/2022 0322  ? K 4.1 03/18/2022 0322  ? CL 106 03/18/2022 0322  ? CO2 24 03/18/2022 0322  ? GLUCOSE 114 (H) 03/18/2022 0322  ? BUN 11 03/18/2022 0322  ? CREATININE 0.68 03/18/2022 0322  ? CALCIUM 8.4 (L) 03/18/2022 0322  ? GFRNONAA >60 03/18/2022 0322  ? ? ? ?Assessment/Plan: ?1 Day Post-Op  ? ?Principal Problem: ?  Osteoarthritis of right hip ?Active Problems: ?  S/P total right hip arthroplasty ? ? ?WBAT with walker ?DVT ppx: Aspirin, SCDs, TEDS ?PO pain control ?PT/OT ?Dispo: D/c home with HEP. ? ? ? ?[place] ?03/18/2022, 4:18 PM ? ? ?Rod Can, MD ?[phone] ?[facility] ?[address], [address], [place], [place] [address] ?Phone: [phone] ?[email or web address] ?Facebook  Engineer, structural  ?  ?  ?

## 2022-03-18 NOTE — TOC Transition Note (Signed)
Transition of Care (TOC) - CM/SW Discharge Note ? ? ?Patient Details  ?Name: Meghan Burgess ?MRN: [id number] ?Date of Birth: October 12, 1971 ? ?Transition of Care (TOC) CM/SW Contact:  ?Clotile Whittington, LCSW ?Phone Number: ?03/18/2022, 10:57 AM ? ? ?Clinical Narrative:    ?Met with pt and confirming she has received her rolling walker via [employer].  Plan for HEP.  No further TOC needs. ? ? ?Final next level of care: Home/Self Care ?Barriers to Discharge: No Barriers Identified ? ? ?Patient Goals and CMS Choice ?Patient states their goals for this hospitalization and ongoing recovery are:: return home ?  ?  ? ?Discharge Placement ?  ?           ?  ?  ?  ?  ? ?Discharge Plan and Services ?  ?  ?           ?DME Arranged: Walker rolling ?DME Agency: [employer] ?  ?  ?  ?  ?  ?  ?  ?  ? ?Social Determinants of Health (SDOH) Interventions ?  ? ? ?Readmission Risk Interventions ?   ? View : No data to display.  ?  ?  ?  ? ? ? ? ? ?

## 2022-03-18 NOTE — Progress Notes (Signed)
Physical Therapy Treatment ?Patient Details ?Name: Meghan Burgess ?MRN: [id number] ?DOB: 1971-02-04 ?Today's Date: 03/18/2022 ? ? ?History of Present Illness Pt is a 51yo female presenting s/p R-THA, anterior approach on 03/17/22. PMH: cervical cancer, GERD, OA. ? ?  ?PT Comments  ? ? Patient has met PT goals for DC ? ?   ?Recommendations for follow up therapy are one component of a multi-disciplinary discharge planning process, led by the attending physician.  Recommendations may be updated based on patient status, additional functional criteria and insurance authorization. ? ?Follow Up Recommendations ? Follow physician's recommendations for discharge plan and follow up therapies ?  ?  ?Assistance Recommended at Discharge Set up Supervision/Assistance  ?Patient can return home with the following A little help with walking and/or transfers;A little help with bathing/dressing/bathroom;Assistance with cooking/housework;Assist for transportation;Help with stairs or ramp for entrance ?  ?Equipment Recommendations ? None recommended by PT  ?  ?Recommendations for Other Services   ? ? ?  ?Precautions / Restrictions Precautions ?Precautions: Fall  ?  ? ?Mobility ? Bed Mobility ?  ?Bed Mobility: Supine to Sit ?  ?  ?Supine to sit: Modified independent (Device/Increase time) ?  ?  ?  ?  ? ?Transfers ?  ?Equipment used: Rolling walker (2 wheels) ?Transfers: Sit to/from Stand ?Sit to Stand: Supervision ?  ?  ?  ?  ?  ?General transfer comment: no support ?  ? ?Ambulation/Gait ?Ambulation/Gait assistance: Min guard ?Gait Distance (Feet): 100 Feet ?Assistive device: Rolling walker (2 wheels) ?Gait Pattern/deviations: Step-to pattern, Step-through pattern ?  ?  ?  ?General Gait Details: cues for sequence ? ? ?Stairs ?Stairs: Yes ?Stairs assistance: Min guard ?Stair Management: Two rails, Step to pattern ?Number of Stairs: 2 ?  ? ? ?Wheelchair Mobility ?  ? ?Modified Rankin (Stroke Patients Only) ?  ? ? ?  ?Balance Overall balance  assessment: Modified Independent ?  ?  ?  ?  ?Standing balance support: Reliant on assistive device for balance, During functional activity, Bilateral upper extremity supported ?Standing balance-Leahy Scale: Good ?  ?  ?  ?  ?  ?  ?  ?  ?  ?  ?  ?  ?  ? ?  ?Cognition   ?  ?  ?  ?  ?  ?  ?  ?  ?  ?  ?  ?  ?  ?  ?  ?  ?  ?  ?  ?  ?  ? ?  ?Exercises Total Joint Exercises ?Quad Sets: AROM, Right ?Heel Slides: AAROM, Right, 5 reps ?Hip ABduction/ADduction: AAROM, Right, 5 reps ?Long Arc Quad: AROM, Right, 10 reps ? ?  ?General Comments   ?  ?  ? ?Pertinent Vitals/Pain Pain Assessment ?Pain Score: 1  ?Pain Location: R hip ?Pain Descriptors / Indicators: Operative site guarding ?Pain Intervention(s): Monitored during session, Premedicated before session, Ice applied  ? ? ?Home Living   ?  ?  ?  ?  ?  ?  ?  ?  ?  ?   ?  ?Prior Function    ?  ?  ?   ? ?PT Goals (current goals can now be found in the care plan section) Progress towards PT goals: Progressing toward goals ? ?  ?Frequency ? ? ? 7X/week ? ? ? ?  ?PT Plan Current plan remains appropriate  ? ? ?Co-evaluation   ?  ?  ?  ?  ? ?  ?AM-PAC PT "  6 Clicks" Mobility   ?Outcome Measure ? Help needed turning from your back to your side while in a flat bed without using bedrails?: A Little ?Help needed moving from lying on your back to sitting on the side of a flat bed without using bedrails?: A Little ?Help needed moving to and from a bed to a chair (including a wheelchair)?: A Little ?Help needed standing up from a chair using your arms (e.g., wheelchair or bedside chair)?: A Little ?Help needed to walk in hospital room?: A Little ?Help needed climbing 3-5 steps with a railing? : A Little ?6 Click Score: 18 ? ?  ?End of Session Equipment Utilized During Treatment: Gait belt ?Activity Tolerance: Other (comment) ?Patient left: in bed;with call bell/phone within reach;with nursing/sitter in room ?Nurse Communication: Mobility status ?PT Visit Diagnosis: Pain;Difficulty in  walking, not elsewhere classified (R26.2) ?Pain - Right/Left: Right ?Pain - part of body: Hip ?  ? ? ?Time: [phone] ?PT Time Calculation (min) (ACUTE ONLY): 28 min ? ?Charges:  $Gait Training: 8-22 mins ?$Therapeutic Exercise: 8-22 mins          ?          ? ?Tresa Endo PT ?Acute Rehabilitation Services ?Pager [phone] ?Office [phone] ? ? ? ?Latoi Giraldo, Shella Maxim ?03/18/2022, 12:30 PM ? ?

## 2022-03-18 NOTE — Discharge Summary (Signed)
Physician Discharge Summary  ?Patient ID: ?Meghan Burgess ?MRN: [id number] ?DOB/AGE: 1971-05-14 51 y.o. ? ?Admit date: 03/17/2022 ?Discharge date: 03/18/2022 ? ?Admission Diagnoses:  ?Osteoarthritis of right hip ? ?Discharge Diagnoses:  ?Principal Problem: ?  Osteoarthritis of right hip ?Active Problems: ?  S/P total right hip arthroplasty ? ? ?Past Medical History:  ?Diagnosis Date  ? Cervical cancer ([facility])   ? adenocarcinoma of cervix  ? GERD (gastroesophageal reflux disease)   ? Headache   ? migraines  ? PONV (postoperative nausea and vomiting)   ? ? ?Surgeries: Procedure(s): ?TOTAL HIP ARTHROPLASTY ANTERIOR APPROACH on 03/17/2022 ?  ?Consultants (if any):  ? ?Discharged Condition: Improved ? ?Hospital Course: Meghan Burgess is an 51 y.o. female who was admitted 03/17/2022 with a diagnosis of Osteoarthritis of right hip and went to the operating room on 03/17/2022 and underwent the above named procedures.   ? ?She was given perioperative antibiotics:  ?Anti-infectives (From admission, onward)  ? ? Start     Dose/Rate Route Frequency Ordered Stop  ? 03/17/22 1600  ceFAZolin (ANCEF) IVPB 2g/100 mL premix       ? 2 g ?200 mL/hr over 30 Minutes Intravenous Every 6 hours 03/17/22 1544 03/17/22 2154  ? 03/17/22 0603  ceFAZolin (ANCEF) 2-4 GM/100ML-% IVPB       ?Note to Pharmacy: Kyra Leyland E: cabinet override  ?    03/17/22 0603 03/17/22 0753  ? 03/17/22 0600  ceFAZolin (ANCEF) IVPB 2g/100 mL premix       ? 2 g ?200 mL/hr over 30 Minutes Intravenous On call to O.R. 03/17/22 0554 03/17/22 0802  ? ?  ? ? ?She was given sequential compression devices, early ambulation, and Aspirin for DVT prophylaxis. ? ?She benefited maximally from the hospital stay and there were no complications.   ? ?Recent vital signs:  ?Vitals:  ? 03/18/22 0631 03/18/22 0950  ?BP: 134/79 123/81  ?Pulse: 76 86  ?Resp: 17 20  ?Temp: 98.8 ?F (37.1 ?C) 97.9 ?F (36.6 ?C)  ?SpO2: 97% 99%  ? ? ?Recent laboratory studies:  ?Lab Results  ?Component Value  Date  ? HGB 11.6 (L) 03/18/2022  ? HGB 15.7 (H) 03/10/2022  ? HGB 11.3 (L) 11/12/2010  ? ?Lab Results  ?Component Value Date  ? WBC 11.3 (H) 03/18/2022  ? PLT 223 03/18/2022  ? ?No results found for: INR ?Lab Results  ?Component Value Date  ? NA 136 03/18/2022  ? K 4.1 03/18/2022  ? CL 106 03/18/2022  ? CO2 24 03/18/2022  ? BUN 11 03/18/2022  ? CREATININE 0.68 03/18/2022  ? GLUCOSE 114 (H) 03/18/2022  ? ? ? ?Allergies as of 03/18/2022   ? ?   Reactions  ? Naproxen Sodium Nausea And Vomiting  ? GI upset  ? Zithromax [azithromycin]   ? GI upset  ? ?  ? ?  ?Medication List  ?  ? ?STOP taking these medications   ? ?acetaminophen 500 MG tablet ?Commonly known as: TYLENOL ?  ? ?  ? ?TAKE these medications   ? ?aspirin 81 MG chewable tablet ?Commonly known as: Aspirin Childrens ?Chew 1 tablet (81 mg total) by mouth 2 (two) times daily with a meal. ?  ?cholecalciferol 25 MCG (1000 UNIT) tablet ?Commonly known as: VITAMIN D3 ?Take 1,000 Units by mouth daily. ?  ?docusate sodium 100 MG capsule ?Commonly known as: Colace ?Take 1 capsule (100 mg total) by mouth 2 (two) times daily. ?  ?famotidine 40 MG tablet ?Commonly known as:  PEPCID ?Take 1 tablet (40 mg total) by mouth every evening. ?  ?HYDROcodone-acetaminophen 5-325 MG tablet ?Commonly known as: Norco ?Take 1 tablet by mouth every 4 (four) hours as needed for moderate pain. ?  ?meloxicam 15 MG tablet ?Commonly known as: MOBIC ?Take 1 tablet (15 mg total) by mouth daily. ?  ?methocarbamol 500 MG tablet ?Commonly known as: Robaxin ?Take 1 tablet (500 mg total) by mouth every 6 (six) hours as needed for muscle spasms. ?  ?ondansetron 4 MG tablet ?Commonly known as: Zofran ?Take 1 tablet (4 mg total) by mouth every 8 (eight) hours as needed for nausea or vomiting. ?  ?phenylephrine 10 MG Tabs tablet ?Commonly known as: SUDAFED PE ?Take 10 mg by mouth at bedtime as needed (congestion). ?  ?polyethylene glycol 17 g packet ?Commonly known as: MiraLax ?Take 17 g by mouth daily as  needed. ?  ?senna 8.6 MG Tabs tablet ?Commonly known as: SENOKOT ?Take 2 tablets (17.2 mg total) by mouth at bedtime. ?  ? ?  ?  ? ? ?WEIGHT BEARING  ? ?Weight bearing as tolerated with assist device (walker, cane, etc) as directed, use it as long as suggested by your surgeon or therapist, typically at least 4-6 weeks. ? ? ?EXERCISES ? ?Results after joint replacement surgery are often greatly improved when you follow the exercise, range of motion and muscle strengthening exercises prescribed by your doctor. Safety measures are also important to protect the joint from further injury. Any time any of these exercises cause you to have increased pain or swelling, decrease what you are doing until you are comfortable again and then slowly increase them. If you have problems or questions, call your caregiver or physical therapist for advice.  ? ?Rehabilitation is important following a joint replacement. After just a few days of immobilization, the muscles of the leg can become weakened and shrink (atrophy).  These exercises are designed to build up the tone and strength of the thigh and leg muscles and to improve motion. Often times heat used for twenty to thirty minutes before working out will loosen up your tissues and help with improving the range of motion but do not use heat for the first two weeks following surgery (sometimes heat can increase post-operative swelling).  ? ?These exercises can be done on a training (exercise) mat, on the floor, on a table or on a bed. Use whatever works the best and is most comfortable for you.    Use music or television while you are exercising so that the exercises are a pleasant break in your day. This will make your life better with the exercises acting as a break in your routine that you can look forward to.   Perform all exercises about fifteen times, three times per day or as directed.  You should exercise both the operative leg and the other leg as well. ? ?Exercises  include: ?  ?Quad Sets - Tighten up the muscle on the front of the thigh (Quad) and hold for 5-10 seconds.   ?Straight Leg Raises - With your knee straight (if you were given a brace, keep it on), lift the leg to 60 degrees, hold for 3 seconds, and slowly lower the leg.  Perform this exercise against resistance later as your leg gets stronger.  ?Leg Slides: Lying on your back, slowly slide your foot toward your buttocks, bending your knee up off the floor (only go as far as is comfortable). Then slowly slide your foot back  down until your leg is flat on the floor again.  ?Angel Wings: Lying on your back spread your legs to the side as far apart as you can without causing discomfort.  ?Hamstring Strength:  Lying on your back, push your heel against the floor with your leg straight by tightening up the muscles of your buttocks.  Repeat, but this time bend your knee to a comfortable angle, and push your heel against the floor.  You may put a pillow under the heel to make it more comfortable if necessary.  ? ?A rehabilitation program following joint replacement surgery can speed recovery and prevent re-injury in the future due to weakened muscles. Contact your doctor or a physical therapist for more information on knee rehabilitation.  ? ? ?CONSTIPATION ? ?Constipation is defined medically as fewer than three stools per week and severe constipation as less than one stool per week.  Even if you have a regular bowel pattern at home, your normal regimen is likely to be disrupted due to multiple reasons following surgery.  Combination of anesthesia, postoperative narcotics, change in appetite and fluid intake all can affect your bowels.  ? ?YOU MUST use at least one of the following options; they are listed in order of increasing strength to get the job done.  They are all available over the counter, and you may need to use some, POSSIBLY even all of these options:   ? ?Drink plenty of fluids (prune juice may be helpful)  and high fiber foods ?Colace 100 mg by mouth twice a day  ?Senokot for constipation as directed and as needed Dulcolax (bisacodyl), take with full glass of water  ?Miralax (polyethylene glycol) once or twice a d

## 2022-04-20 ENCOUNTER — Encounter (HOSPITAL_COMMUNITY): Payer: Self-pay | Admitting: Orthopedic Surgery

## 2022-04-20 ENCOUNTER — Other Ambulatory Visit: Payer: Self-pay

## 2022-04-20 ENCOUNTER — Ambulatory Visit: Payer: Self-pay | Admitting: Student

## 2022-04-20 NOTE — Progress Notes (Signed)
For Short Stay: ?Learned appointment date: N/A ?Date of COVID positive in last 62 days:N/A ? ?Bowel Prep reminder: N/A ? ? ?For Anesthesia: ?PCP - Elie Confer, NP ?Cardiologist - N/A ? ?Chest x-ray - N/A ?EKG - N/A ?Stress Test - N/A ?ECHO - N/A ?Cardiac Cath - N/A ?Pacemaker/ICD device last checked:N/A ?Pacemaker orders received: N/A ?Device Rep notified: N/A ? ?Spinal Cord Stimulator:N/A ? ?Sleep Study - N/A ?CPAP - N/A ? ?Fasting Blood Sugar - N/A ?Checks Blood Sugar ____N/A_ times a day ?Date and result of last Hgb A1c-N/A ? ?Blood Thinner Instructions:N/A ?Aspirin Instructions: Diona Fanti ?Last Dose: remain on ? ?Activity level: Can go up a flight of stairs and activities of daily living without stopping and without chest pain and/or shortness of breath ?     ? ?Anesthesia review: N/A ? ?Patient denies shortness of breath, fever, cough and chest pain at PAT appointment ? ? ?Patient verbalized understanding of instructions that were given to them at the PAT appointment. Patient was also instructed that they will need to review over the PAT instructions again at home before surgery.  ?

## 2022-04-21 ENCOUNTER — Inpatient Hospital Stay (HOSPITAL_COMMUNITY): Payer: PRIVATE HEALTH INSURANCE | Admitting: Anesthesiology

## 2022-04-21 ENCOUNTER — Other Ambulatory Visit: Payer: Self-pay

## 2022-04-21 ENCOUNTER — Encounter (HOSPITAL_COMMUNITY): Payer: Self-pay | Admitting: Orthopedic Surgery

## 2022-04-21 ENCOUNTER — Inpatient Hospital Stay (HOSPITAL_COMMUNITY): Payer: PRIVATE HEALTH INSURANCE

## 2022-04-21 ENCOUNTER — Inpatient Hospital Stay (HOSPITAL_COMMUNITY)
Admission: RE | Admit: 2022-04-21 | Discharge: 2022-04-24 | DRG: 902 | Disposition: A | Discharge status: Home / Self Care | Payer: PRIVATE HEALTH INSURANCE | Source: Ambulatory Visit | Attending: Orthopedic Surgery | Admitting: Orthopedic Surgery

## 2022-04-21 ENCOUNTER — Encounter (HOSPITAL_COMMUNITY)
Admission: RE | Disposition: A | Discharge status: Home / Self Care | Payer: Self-pay | Source: Ambulatory Visit | Attending: Orthopedic Surgery

## 2022-04-21 DIAGNOSIS — T8130XA Disruption of wound, unspecified, initial encounter: Secondary | ICD-10-CM | POA: Diagnosis not present

## 2022-04-21 DIAGNOSIS — Z791 Long term (current) use of non-steroidal anti-inflammatories (NSAID): Secondary | ICD-10-CM | POA: Diagnosis not present

## 2022-04-21 DIAGNOSIS — T8131XA Disruption of external operation (surgical) wound, not elsewhere classified, initial encounter: Principal | ICD-10-CM | POA: Diagnosis present

## 2022-04-21 DIAGNOSIS — Z79899 Other long term (current) drug therapy: Secondary | ICD-10-CM | POA: Diagnosis not present

## 2022-04-21 DIAGNOSIS — G43909 Migraine, unspecified, not intractable, without status migrainosus: Secondary | ICD-10-CM | POA: Diagnosis present

## 2022-04-21 DIAGNOSIS — Z888 Allergy status to other drugs, medicaments and biological substances status: Secondary | ICD-10-CM | POA: Diagnosis not present

## 2022-04-21 DIAGNOSIS — Z96641 Presence of right artificial hip joint: Secondary | ICD-10-CM | POA: Diagnosis present

## 2022-04-21 DIAGNOSIS — K219 Gastro-esophageal reflux disease without esophagitis: Secondary | ICD-10-CM | POA: Diagnosis present

## 2022-04-21 DIAGNOSIS — Z8541 Personal history of malignant neoplasm of cervix uteri: Secondary | ICD-10-CM

## 2022-04-21 DIAGNOSIS — Z881 Allergy status to other antibiotic agents status: Secondary | ICD-10-CM | POA: Diagnosis not present

## 2022-04-21 DIAGNOSIS — L03115 Cellulitis of right lower limb: Secondary | ICD-10-CM | POA: Diagnosis present

## 2022-04-21 DIAGNOSIS — I96 Gangrene, not elsewhere classified: Secondary | ICD-10-CM | POA: Diagnosis present

## 2022-04-21 DIAGNOSIS — M1990 Unspecified osteoarthritis, unspecified site: Secondary | ICD-10-CM | POA: Diagnosis present

## 2022-04-21 DIAGNOSIS — Z7982 Long term (current) use of aspirin: Secondary | ICD-10-CM | POA: Diagnosis not present

## 2022-04-21 DIAGNOSIS — Z806 Family history of leukemia: Secondary | ICD-10-CM

## 2022-04-21 DIAGNOSIS — Y838 Other surgical procedures as the cause of abnormal reaction of the patient, or of later complication, without mention of misadventure at the time of the procedure: Secondary | ICD-10-CM | POA: Diagnosis present

## 2022-04-21 DIAGNOSIS — Z01818 Encounter for other preprocedural examination: Principal | ICD-10-CM

## 2022-04-21 HISTORY — PX: INCISION AND DRAINAGE HIP: SHX1801

## 2022-04-21 LAB — CBC
HCT: 36 % (ref 36.0–46.0)
Hemoglobin: 12.3 g/dL (ref 12.0–15.0)
MCH: 30.4 pg (ref 26.0–34.0)
MCHC: 34.2 g/dL (ref 30.0–36.0)
MCV: 88.9 fL (ref 80.0–100.0)
Platelets: 411 10*3/uL — ABNORMAL HIGH (ref 150–400)
RBC: 4.05 MIL/uL (ref 3.87–5.11)
RDW: 13.5 % (ref 11.5–15.5)
WBC: 9.5 10*3/uL (ref 4.0–10.5)
nRBC: 0 % (ref 0.0–0.2)

## 2022-04-21 SURGERY — IRRIGATION AND DEBRIDEMENT HIP
Anesthesia: General | Site: Hip | Laterality: Right

## 2022-04-21 MED ORDER — KETOROLAC TROMETHAMINE 30 MG/ML IJ SOLN
INTRAMUSCULAR | Status: AC
  Filled 2022-04-21: qty 1

## 2022-04-21 MED ORDER — ONDANSETRON HCL 4 MG/2ML IJ SOLN
INTRAMUSCULAR | Status: DC | PRN
  Administered 2022-04-21: 4 mg via INTRAVENOUS

## 2022-04-21 MED ORDER — CHLORHEXIDINE GLUCONATE 0.12 % MT SOLN
15.0000 mL | Freq: Once | OROMUCOSAL | Status: AC
  Administered 2022-04-21: 15 mL via OROMUCOSAL

## 2022-04-21 MED ORDER — SODIUM CHLORIDE 0.9 % IV SOLN: INTRAVENOUS | Status: DC

## 2022-04-21 MED ORDER — ACETAMINOPHEN 325 MG PO TABS
325.0000 mg | ORAL_TABLET | Freq: Four times a day (QID) | ORAL | Status: DC | PRN
  Administered 2022-04-22: 650 mg via ORAL
  Filled 2022-04-21: qty 2

## 2022-04-21 MED ORDER — LIDOCAINE HCL (PF) 2 % IJ SOLN
INTRAMUSCULAR | Status: AC
  Filled 2022-04-21: qty 5

## 2022-04-21 MED ORDER — ALUM & MAG HYDROXIDE-SIMETH 200-200-20 MG/5ML PO SUSP: 30.0000 mL | ORAL | Status: DC | PRN

## 2022-04-21 MED ORDER — OXYCODONE HCL 5 MG/5ML PO SOLN: 5.0000 mg | Freq: Once | ORAL | Status: DC | PRN

## 2022-04-21 MED ORDER — MENTHOL 3 MG MT LOZG: 1.0000 | LOZENGE | OROMUCOSAL | Status: DC | PRN

## 2022-04-21 MED ORDER — METOCLOPRAMIDE HCL 5 MG PO TABS: 5.0000 mg | ORAL_TABLET | Freq: Three times a day (TID) | ORAL | Status: DC | PRN

## 2022-04-21 MED ORDER — VANCOMYCIN HCL 750 MG/150ML IV SOLN
750.0000 mg | Freq: Two times a day (BID) | INTRAVENOUS | Status: DC
Start: 2022-04-22 — End: 2022-04-24
  Administered 2022-04-22 – 2022-04-24 (×5): 750 mg via INTRAVENOUS
  Filled 2022-04-21 (×6): qty 150

## 2022-04-21 MED ORDER — METOCLOPRAMIDE HCL 5 MG/ML IJ SOLN: 5.0000 mg | Freq: Three times a day (TID) | INTRAMUSCULAR | Status: DC | PRN

## 2022-04-21 MED ORDER — FENTANYL CITRATE (PF) 100 MCG/2ML IJ SOLN
INTRAMUSCULAR | Status: AC
  Filled 2022-04-21: qty 2

## 2022-04-21 MED ORDER — HYDROCODONE-ACETAMINOPHEN 7.5-325 MG PO TABS: 1.0000 | ORAL_TABLET | ORAL | Status: DC | PRN

## 2022-04-21 MED ORDER — ISOPROPYL ALCOHOL 70 % SOLN
Status: AC
  Filled 2022-04-21: qty 480

## 2022-04-21 MED ORDER — MIDAZOLAM HCL 2 MG/2ML IJ SOLN
INTRAMUSCULAR | Status: AC
  Filled 2022-04-21: qty 2

## 2022-04-21 MED ORDER — ROCURONIUM BROMIDE 10 MG/ML (PF) SYRINGE
PREFILLED_SYRINGE | INTRAVENOUS | Status: AC
  Filled 2022-04-21: qty 10

## 2022-04-21 MED ORDER — ASPIRIN 81 MG PO CHEW
81.0000 mg | CHEWABLE_TABLET | Freq: Two times a day (BID) | ORAL | Status: DC
  Administered 2022-04-21 – 2022-04-24 (×6): 81 mg via ORAL
  Filled 2022-04-21 (×6): qty 1

## 2022-04-21 MED ORDER — ONDANSETRON HCL 4 MG/2ML IJ SOLN
INTRAMUSCULAR | Status: AC
  Filled 2022-04-21: qty 2

## 2022-04-21 MED ORDER — ISOPROPYL ALCOHOL 70 % SOLN
Status: DC | PRN
  Administered 2022-04-21: 1 via TOPICAL

## 2022-04-21 MED ORDER — PROPOFOL 10 MG/ML IV BOLUS
INTRAVENOUS | Status: AC
  Filled 2022-04-21: qty 20

## 2022-04-21 MED ORDER — DOCUSATE SODIUM 100 MG PO CAPS
100.0000 mg | ORAL_CAPSULE | Freq: Two times a day (BID) | ORAL | Status: DC
  Administered 2022-04-21 – 2022-04-23 (×4): 100 mg via ORAL
  Filled 2022-04-21 (×6): qty 1

## 2022-04-21 MED ORDER — TRANEXAMIC ACID-NACL 1000-0.7 MG/100ML-% IV SOLN
1000.0000 mg | INTRAVENOUS | Status: AC
  Administered 2022-04-21: 1000 mg via INTRAVENOUS
  Filled 2022-04-21: qty 100

## 2022-04-21 MED ORDER — AMISULPRIDE (ANTIEMETIC) 5 MG/2ML IV SOLN: 10.0000 mg | Freq: Once | INTRAVENOUS | Status: DC | PRN

## 2022-04-21 MED ORDER — VANCOMYCIN HCL 1500 MG/300ML IV SOLN
1500.0000 mg | INTRAVENOUS | Status: AC
  Administered 2022-04-21: 1500 mg via INTRAVENOUS
  Filled 2022-04-21: qty 300

## 2022-04-21 MED ORDER — KETOROLAC TROMETHAMINE 15 MG/ML IJ SOLN
15.0000 mg | Freq: Four times a day (QID) | INTRAMUSCULAR | Status: AC
  Administered 2022-04-21 – 2022-04-22 (×4): 15 mg via INTRAVENOUS
  Filled 2022-04-21 (×4): qty 1

## 2022-04-21 MED ORDER — SUGAMMADEX SODIUM 200 MG/2ML IV SOLN
INTRAVENOUS | Status: DC | PRN
  Administered 2022-04-21: 200 mg via INTRAVENOUS

## 2022-04-21 MED ORDER — FENTANYL CITRATE PF 50 MCG/ML IJ SOSY
25.0000 ug | PREFILLED_SYRINGE | INTRAMUSCULAR | Status: DC | PRN
  Administered 2022-04-21: 25 ug via INTRAVENOUS

## 2022-04-21 MED ORDER — DIPHENHYDRAMINE HCL 12.5 MG/5ML PO ELIX: 12.5000 mg | ORAL_SOLUTION | ORAL | Status: DC | PRN

## 2022-04-21 MED ORDER — METHOCARBAMOL 500 MG IVPB - SIMPLE MED
500.0000 mg | Freq: Four times a day (QID) | INTRAVENOUS | Status: DC | PRN
  Filled 2022-04-21: qty 50

## 2022-04-21 MED ORDER — PROPOFOL 1000 MG/100ML IV EMUL
INTRAVENOUS | Status: AC
  Filled 2022-04-21: qty 100

## 2022-04-21 MED ORDER — SUGAMMADEX SODIUM 500 MG/5ML IV SOLN
INTRAVENOUS | Status: AC
  Filled 2022-04-21: qty 5

## 2022-04-21 MED ORDER — DEXAMETHASONE SODIUM PHOSPHATE 10 MG/ML IJ SOLN
INTRAMUSCULAR | Status: AC
  Filled 2022-04-21: qty 1

## 2022-04-21 MED ORDER — POVIDONE-IODINE 10 % EX SWAB: 2.0000 "application " | Freq: Once | CUTANEOUS | Status: DC

## 2022-04-21 MED ORDER — ONDANSETRON HCL 4 MG PO TABS: 4.0000 mg | ORAL_TABLET | Freq: Four times a day (QID) | ORAL | Status: DC | PRN

## 2022-04-21 MED ORDER — MORPHINE SULFATE (PF) 2 MG/ML IV SOLN: 0.5000 mg | INTRAVENOUS | Status: DC | PRN

## 2022-04-21 MED ORDER — OXYCODONE HCL 5 MG PO TABS: 5.0000 mg | ORAL_TABLET | Freq: Once | ORAL | Status: DC | PRN

## 2022-04-21 MED ORDER — SODIUM CHLORIDE 0.9 % IR SOLN
Status: DC | PRN
  Administered 2022-04-21: 3000 mL
  Administered 2022-04-21: 1000 mL

## 2022-04-21 MED ORDER — CEFAZOLIN SODIUM-DEXTROSE 2-4 GM/100ML-% IV SOLN
2.0000 g | INTRAVENOUS | Status: AC
  Administered 2022-04-21: 2 g via INTRAVENOUS
  Filled 2022-04-21: qty 100

## 2022-04-21 MED ORDER — ROCURONIUM BROMIDE 10 MG/ML (PF) SYRINGE
PREFILLED_SYRINGE | INTRAVENOUS | Status: DC | PRN
  Administered 2022-04-21: 80 mg via INTRAVENOUS

## 2022-04-21 MED ORDER — POVIDONE-IODINE 10 % EX SWAB
2.0000 "application " | Freq: Once | CUTANEOUS | Status: AC
  Administered 2022-04-21: 2 via TOPICAL

## 2022-04-21 MED ORDER — METHOCARBAMOL 500 MG PO TABS
500.0000 mg | ORAL_TABLET | Freq: Four times a day (QID) | ORAL | Status: DC | PRN
  Administered 2022-04-22: 500 mg via ORAL
  Filled 2022-04-21: qty 1

## 2022-04-21 MED ORDER — PROPOFOL 500 MG/50ML IV EMUL
INTRAVENOUS | Status: DC | PRN
  Administered 2022-04-21: 120 ug/kg/min via INTRAVENOUS

## 2022-04-21 MED ORDER — ONDANSETRON HCL 4 MG/2ML IJ SOLN: 4.0000 mg | Freq: Once | INTRAMUSCULAR | Status: DC | PRN

## 2022-04-21 MED ORDER — LIDOCAINE HCL (CARDIAC) PF 100 MG/5ML IV SOSY
PREFILLED_SYRINGE | INTRAVENOUS | Status: DC | PRN
  Administered 2022-04-21: 100 mg via INTRAVENOUS

## 2022-04-21 MED ORDER — PHENOL 1.4 % MT LIQD: 1.0000 | OROMUCOSAL | Status: DC | PRN

## 2022-04-21 MED ORDER — ORAL CARE MOUTH RINSE: 15.0000 mL | Freq: Once | OROMUCOSAL | Status: AC

## 2022-04-21 MED ORDER — LACTATED RINGERS IV SOLN: INTRAVENOUS | Status: DC

## 2022-04-21 MED ORDER — PROPOFOL 10 MG/ML IV BOLUS
INTRAVENOUS | Status: DC | PRN
  Administered 2022-04-21: 150 mg via INTRAVENOUS

## 2022-04-21 MED ORDER — METHOCARBAMOL 500 MG IVPB - SIMPLE MED
INTRAVENOUS | Status: AC
  Administered 2022-04-21: 500 mg via INTRAVENOUS
  Filled 2022-04-21: qty 50

## 2022-04-21 MED ORDER — ACETAMINOPHEN 500 MG PO TABS
1000.0000 mg | ORAL_TABLET | Freq: Once | ORAL | Status: AC
  Administered 2022-04-21: 1000 mg via ORAL
  Filled 2022-04-21: qty 2

## 2022-04-21 MED ORDER — FENTANYL CITRATE (PF) 100 MCG/2ML IJ SOLN
INTRAMUSCULAR | Status: DC | PRN
  Administered 2022-04-21: 100 ug via INTRAVENOUS

## 2022-04-21 MED ORDER — FAMOTIDINE 20 MG PO TABS
40.0000 mg | ORAL_TABLET | Freq: Every evening | ORAL | Status: DC
  Administered 2022-04-21 – 2022-04-23 (×3): 40 mg via ORAL
  Filled 2022-04-21 (×3): qty 2

## 2022-04-21 MED ORDER — DEXAMETHASONE SODIUM PHOSPHATE 10 MG/ML IJ SOLN
INTRAMUSCULAR | Status: DC | PRN
  Administered 2022-04-21: 10 mg via INTRAVENOUS

## 2022-04-21 MED ORDER — SENNA 8.6 MG PO TABS
2.0000 | ORAL_TABLET | Freq: Every day | ORAL | Status: DC
  Administered 2022-04-21 – 2022-04-22 (×2): 17.2 mg via ORAL
  Filled 2022-04-21 (×4): qty 2

## 2022-04-21 MED ORDER — PIPERACILLIN-TAZOBACTAM 3.375 G IVPB
3.3750 g | Freq: Three times a day (TID) | INTRAVENOUS | Status: DC
Start: 2022-04-21 — End: 2022-04-22
  Administered 2022-04-21 – 2022-04-22 (×2): 3.375 g via INTRAVENOUS
  Filled 2022-04-21 (×2): qty 50

## 2022-04-21 MED ORDER — MIDAZOLAM HCL 5 MG/5ML IJ SOLN
INTRAMUSCULAR | Status: DC | PRN
  Administered 2022-04-21: 2 mg via INTRAVENOUS

## 2022-04-21 MED ORDER — FENTANYL CITRATE PF 50 MCG/ML IJ SOSY
PREFILLED_SYRINGE | INTRAMUSCULAR | Status: AC
  Filled 2022-04-21: qty 1

## 2022-04-21 MED ORDER — BUPIVACAINE-EPINEPHRINE (PF) 0.25% -1:200000 IJ SOLN
INTRAMUSCULAR | Status: AC
  Filled 2022-04-21: qty 30

## 2022-04-21 MED ORDER — ONDANSETRON HCL 4 MG/2ML IJ SOLN: 4.0000 mg | Freq: Four times a day (QID) | INTRAMUSCULAR | Status: DC | PRN

## 2022-04-21 MED ORDER — POLYETHYLENE GLYCOL 3350 17 G PO PACK: 17.0000 g | PACK | Freq: Every day | ORAL | Status: DC | PRN

## 2022-04-21 MED ORDER — SODIUM CHLORIDE (PF) 0.9 % IJ SOLN
INTRAMUSCULAR | Status: AC
  Filled 2022-04-21: qty 50

## 2022-04-21 MED ORDER — HYDROCODONE-ACETAMINOPHEN 5-325 MG PO TABS
1.0000 | ORAL_TABLET | ORAL | Status: DC | PRN
  Filled 2022-04-21: qty 1

## 2022-04-21 SURGICAL SUPPLY — 65 items
BAG ZIPLOCK 12X15 (MISCELLANEOUS) ×2 IMPLANT
CHLORAPREP W/TINT 26 (MISCELLANEOUS) ×1 IMPLANT
COVER PERINEAL POST (MISCELLANEOUS) ×2 IMPLANT
COVER SURGICAL LIGHT HANDLE (MISCELLANEOUS) ×2 IMPLANT
DRAPE IMP U-DRAPE 54X76 (DRAPES) ×3 IMPLANT
DRAPE SHEET LG 3/4 BI-LAMINATE (DRAPES) ×6 IMPLANT
DRAPE STERI IOBAN 125X83 (DRAPES) ×2 IMPLANT
DRAPE U-SHAPE 47X51 STRL (DRAPES) ×4 IMPLANT
DRESSING PEEL AND PLAC PRVNA20 (GAUZE/BANDAGES/DRESSINGS) IMPLANT
DRESSING PEEL AND PLC PRVNA 13 (GAUZE/BANDAGES/DRESSINGS) IMPLANT
DRESSING PREVENA PLUS CUSTOM (GAUZE/BANDAGES/DRESSINGS) IMPLANT
DRSG AQUACEL AG ADV 3.5X10 (GAUZE/BANDAGES/DRESSINGS) IMPLANT
DRSG PEEL AND PLACE PREVENA 13 (GAUZE/BANDAGES/DRESSINGS) ×2
DRSG PEEL AND PLACE PREVENA 20 (GAUZE/BANDAGES/DRESSINGS)
DRSG PREVENA PLUS CUSTOM (GAUZE/BANDAGES/DRESSINGS)
DRSG TEGADERM 4X4.75 (GAUZE/BANDAGES/DRESSINGS) ×1 IMPLANT
ELECT BLADE TIP CTD 4 INCH (ELECTRODE) ×1 IMPLANT
ELECT REM PT RETURN 15FT ADLT (MISCELLANEOUS) ×2 IMPLANT
EVACUATOR DRAINAGE 10X20 100CC (DRAIN) IMPLANT
EVACUATOR SILICONE 100CC (DRAIN) ×2
GAUZE SPONGE 2X2 8PLY STRL LF (GAUZE/BANDAGES/DRESSINGS) IMPLANT
GLOVE BIO SURGEON STRL SZ 6.5 (GLOVE) ×2 IMPLANT
GLOVE BIO SURGEON STRL SZ8.5 (GLOVE) ×6 IMPLANT
GLOVE BIOGEL M 8.0 STRL (GLOVE) ×2 IMPLANT
GLOVE BIOGEL M SZ8.5 STRL (GLOVE) ×2 IMPLANT
GLOVE BIOGEL PI IND STRL 7.0 (GLOVE) ×1 IMPLANT
GLOVE BIOGEL PI IND STRL 7.5 (GLOVE) IMPLANT
GLOVE BIOGEL PI IND STRL 8.5 (GLOVE) ×1 IMPLANT
GLOVE BIOGEL PI INDICATOR 7.0 (GLOVE) ×1
GLOVE BIOGEL PI INDICATOR 7.5 (GLOVE) ×2
GLOVE BIOGEL PI INDICATOR 8.5 (GLOVE) ×1
GOWN SPEC L3 XXLG W/TWL (GOWN DISPOSABLE) ×2 IMPLANT
GOWN SPEC L4 XLG W/TWL (GOWN DISPOSABLE) ×2 IMPLANT
HANDPIECE INTERPULSE COAX TIP (DISPOSABLE) ×2
HOOD PEEL AWAY FLYTE STAYCOOL (MISCELLANEOUS) ×6 IMPLANT
JET LAVAGE IRRISEPT WOUND (IRRIGATION / IRRIGATOR)
KIT DRSG PREVENA PLUS 7DAY 125 (MISCELLANEOUS) IMPLANT
KIT TURNOVER KIT A (KITS) ×1 IMPLANT
LAVAGE JET IRRISEPT WOUND (IRRIGATION / IRRIGATOR) IMPLANT
MANIFOLD NEPTUNE II (INSTRUMENTS) ×2 IMPLANT
MARKER SKIN DUAL TIP RULER LAB (MISCELLANEOUS) ×2 IMPLANT
NDL SAFETY ECLIPSE 18X1.5 (NEEDLE) IMPLANT
NDL SPNL 18GX3.5 QUINCKE PK (NEEDLE) IMPLANT
NEEDLE HYPO 18GX1.5 SHARP (NEEDLE)
NEEDLE SPNL 18GX3.5 QUINCKE PK (NEEDLE) IMPLANT
NS IRRIG 1000ML POUR BTL (IV SOLUTION) ×2 IMPLANT
PACK ANTERIOR HIP CUSTOM (KITS) IMPLANT
PENCIL SMOKE EVACUATOR (MISCELLANEOUS) IMPLANT
SET HNDPC FAN SPRY TIP SCT (DISPOSABLE) ×1 IMPLANT
SPONGE GAUZE 2X2 STER 10/PKG (GAUZE/BANDAGES/DRESSINGS) ×1
STAPLER VISISTAT 35W (STAPLE) ×1 IMPLANT
SUT ETHILON 2 0 PSLX (SUTURE) ×4 IMPLANT
SUT MNCRL AB 3-0 PS2 18 (SUTURE) IMPLANT
SUT MNCRL AB 4-0 PS2 18 (SUTURE) IMPLANT
SUT MON AB 2-0 CT1 36 (SUTURE) ×3 IMPLANT
SUT STRATAFIX PDO 1 14 VIOLET (SUTURE)
SUT STRATFX PDO 1 14 VIOLET (SUTURE)
SUT VIC AB 2-0 CT1 27 (SUTURE)
SUT VIC AB 2-0 CT1 TAPERPNT 27 (SUTURE) IMPLANT
SUTURE STRATFX PDO 1 14 VIOLET (SUTURE) IMPLANT
SWAB COLLECTION DEVICE MRSA (MISCELLANEOUS) ×2 IMPLANT
SWAB CULTURE ESWAB REG 1ML (MISCELLANEOUS) ×2 IMPLANT
SYR 3ML LL SCALE MARK (SYRINGE) IMPLANT
TRAY FOLEY MTR SLVR 16FR STAT (SET/KITS/TRAYS/PACK) IMPLANT
TUBE SUCTION HIGH CAP CLEAR NV (SUCTIONS) ×1 IMPLANT

## 2022-04-21 NOTE — Anesthesia Preprocedure Evaluation (Addendum)
Anesthesia Evaluation  ?Patient identified by MRN, date of birth, ID band ?Patient awake ? ? ? ?Reviewed: ?Allergy & Precautions, NPO status , Patient's Chart, lab work & pertinent test results ? ?History of Anesthesia Complications ?(+) PONV and history of anesthetic complications ? ?Airway ?Mallampati: II ? ?TM Distance: >3 FB ?Neck ROM: Full ? ? ? Dental ?no notable dental hx. ? ?  ?Pulmonary ?neg pulmonary ROS,  ?  ?Pulmonary exam normal ?breath sounds clear to auscultation ? ? ? ? ? ? Cardiovascular ?Exercise Tolerance: Good ?negative cardio ROS ?Normal cardiovascular exam ?Rhythm:Regular Rate:Normal ? ? ?  ?Neuro/Psych ? Headaches, negative psych ROS  ? GI/Hepatic ?Neg liver ROS, GERD  Medicated,  ?Endo/Other  ?negative endocrine ROS ? Renal/GU ?negative Renal ROS  ?negative genitourinary ?  ?Musculoskeletal ? ?(+) Arthritis , Osteoarthritis,   ? Abdominal ?  ?Peds ?negative pediatric ROS ?(+)  Hematology ?negative hematology ROS ?(+)   ?Anesthesia Other Findings ?Cervical cancer ? Reproductive/Obstetrics ?negative OB ROS ? ?  ? ? ? ? ? ? ? ? ? ? ? ? ? ?  ?  ? ? ? ? ? ? ? ?Anesthesia Physical ?Anesthesia Plan ? ?ASA: 2 and emergent ? ?Anesthesia Plan: General  ? ?Post-op Pain Management:   ? ?Induction: Intravenous ? ?PONV Risk Score and Plan: 4 or greater and Treatment may vary due to age or medical condition, Ondansetron, Dexamethasone, TIVA and Midazolam ? ?Airway Management Planned: Oral ETT ? ?Additional Equipment: None ? ?Intra-op Plan:  ? ?Post-operative Plan: Extubation in OR ? ?Informed Consent: I have reviewed the patients History and Physical, chart, labs and discussed the procedure including the risks, benefits and alternatives for the proposed anesthesia with the patient or authorized representative who has indicated his/her understanding and acceptance.  ? ? ? ?Dental advisory given ? ?Plan Discussed with: Anesthesiologist, CRNA and Surgeon ? ?Anesthesia Plan  Comments:   ? ? ? ? ? ?Anesthesia Quick Evaluation ? ?

## 2022-04-21 NOTE — H&P (Signed)
PREOPERATIVE H&P ? ?Chief Complaint: Wound dehisance right hip ? ?HPI: ?Meghan Burgess is a 51 y.o. female who underwent primary right total hip arthroplasty on 03/17/2022.  Her hip replacement is doing very well, she has no groin pain.  She presented to the office last week with eschar on the incision compatible with delayed wound healing and cellulitis over the lateral thigh.  I placed her on oral doxycycline and saw her back earlier this week.  At that time, the lateral half of her surgical incision dehisced and she had a significant amount of necrotic subcutaneous fatty tissue.  A limited debridement was performed, a culture was obtained, and wet-to-dry packing commenced.  I also placed her on Bactrim DS at that point as well. She was indicated for surgical management.  ? ?Past Medical History:  ?Diagnosis Date  ? Cervical cancer ([place])   ? adenocarcinoma of cervix  ? GERD (gastroesophageal reflux disease)   ? Headache   ? migraines  ? PONV (postoperative nausea and vomiting)   ? ?Past Surgical History:  ?Procedure Laterality Date  ? ABDOMINAL HYSTERECTOMY  11/10/2010  ? Radical with pelvic lymphadenectomy  ? CHOLECYSTECTOMY    ? TOTAL HIP ARTHROPLASTY Right 03/17/2022  ? TOTAL HIP ARTHROPLASTY Right 03/17/2022  ? Procedure: TOTAL HIP ARTHROPLASTY ANTERIOR APPROACH;  Surgeon: Rod Can, MD;  Location: [facility]S;  Service: Orthopedics;  Laterality: Right;  ? ?Social History  ? ?Socioeconomic History  ? Marital status: Married  ?  Spouse name: Not on file  ? Number of children: Not on file  ? Years of education: Not on file  ? Highest education level: Not on file  ?Occupational History  ? Not on file  ?Tobacco Use  ? Smoking status: Never  ? Smokeless tobacco: Not on file  ?Vaping Use  ? Vaping Use: Never used  ?Substance and Sexual Activity  ? Alcohol use: No  ? Drug use: No  ? Sexual activity: Yes  ?Other Topics Concern  ? Not on file  ?Social History Narrative  ? Not on file  ? ?Social Determinants of Health   ? ?Financial Resource Strain: Not on file  ?Food Insecurity: Not on file  ?Transportation Needs: Not on file  ?Physical Activity: Not on file  ?Stress: Not on file  ?Social Connections: Not on file  ? ?Family History  ?Problem Relation Age of Onset  ? Leukemia Mother   ? Leukemia Other   ? ?Allergies  ?Allergen Reactions  ? Naproxen Sodium Nausea And Vomiting  ?  GI upset  ? Zithromax [Azithromycin]   ?  GI upset  ? ?Prior to Admission medications   ?Medication Sig Start Date End Date Taking? Authorizing Provider  ?acetaminophen (TYLENOL) 500 MG tablet Take 500 mg by mouth every 6 (six) hours as needed for moderate pain, fever or mild pain.   Yes [provider]  ?aspirin (ASPIRIN CHILDRENS) 81 MG chewable tablet Chew 1 tablet (81 mg total) by mouth 2 (two) times daily with a meal. 03/17/22 05/01/22 Yes Neizan Debruhl, Aaron Edelman, MD  ?docusate sodium (COLACE) 100 MG capsule Take 1 capsule (100 mg total) by mouth 2 (two) times daily. 03/17/22 05/16/22 Yes Zyona Pettaway, Aaron Edelman, MD  ?doxycycline (MONODOX) 100 MG capsule Take 100 mg by mouth 2 (two) times daily.   Yes [provider]  ?famotidine (PEPCID) 40 MG tablet Take 1 tablet (40 mg total) by mouth every evening. 03/17/22 06/15/22 Yes Trusten Hume, Aaron Edelman, MD  ?HYDROcodone-acetaminophen (NORCO) 5-325 MG tablet Take 1 tablet by  mouth every 4 (four) hours as needed for moderate pain. 03/17/22  Yes Vola Beneke, Aaron Edelman, MD  ?meloxicam (MOBIC) 15 MG tablet Take 1 tablet (15 mg total) by mouth daily. 03/17/22  Yes Elwood Bazinet, Aaron Edelman, MD  ?methocarbamol (ROBAXIN) 500 MG tablet Take 1 tablet (500 mg total) by mouth every 6 (six) hours as needed for muscle spasms. 03/17/22  Yes Sadiyah Kangas, Aaron Edelman, MD  ?ondansetron (ZOFRAN) 4 MG tablet Take 1 tablet (4 mg total) by mouth every 8 (eight) hours as needed for nausea or vomiting. 03/17/22  Yes Jewelle Whitner, Aaron Edelman, MD  ?phenylephrine (SUDAFED PE) 10 MG TABS tablet Take 10 mg by mouth at bedtime as needed (congestion).   Yes [provider]   ?polyethylene glycol (MIRALAX) 17 g packet Take 17 g by mouth daily as needed. ?Patient taking differently: Take 17 g by mouth daily as needed for mild constipation or moderate constipation. 03/17/22  Yes Elpidio Thielen, Aaron Edelman, MD  ?Probiotic Product (PROBIOTIC PO) Take 1 tablet by mouth daily.   Yes [provider]  ?senna (SENOKOT) 8.6 MG TABS tablet Take 2 tablets (17.2 mg total) by mouth at bedtime. ?Patient taking differently: Take 2 tablets by mouth daily as needed for mild constipation or moderate constipation. 03/17/22 05/16/22 Yes Talibah Colasurdo, Aaron Edelman, MD  ?sulfamethoxazole-trimethoprim (BACTRIM DS) 800-160 MG tablet Take 1 tablet by mouth every 12 (twelve) hours.   Yes [provider]  ? ? ? ?Positive ROS: All other systems have been reviewed and were otherwise negative with the exception of those mentioned in the HPI and as above. ? ?Physical Exam: ?General: Alert, no acute distress ?Cardiovascular: No pedal edema ?Respiratory: No cyanosis, no use of accessory musculature ?GI: No organomegaly, abdomen is soft and non-tender ?Skin: No lesions in the area of chief complaint ?Neurologic: Sensation intact distally ?Psychiatric: Patient is competent for consent with normal mood and affect ?Lymphatic: No axillary or cervical lymphadenopathy ? ?MUSCULOSKELETAL: Examination of the right hip reveals cellulitis over the lateral thigh.  She has a wet-to-dry dressing within the hip.  She is neurovascularly intact.  She has painless logrolling of the hip. ? ?Assessment: ?Wound dehiscence right hip ? ?Plan: ?Plan for Procedure(s): ?IRRIGATION AND DEBRIDEMENT HIP WITH POSSIBLE HEAD/LINER EXCHANGE ? ?The risks benefits and alternatives were discussed with the patient including but not limited to the risks of nonoperative treatment, versus surgical intervention including infection, bleeding, nerve injury,  blood clots, cardiopulmonary complications, morbidity, mortality, among others, and they were willing to  proceed.  ? ?Bertram Savin, MD ?[phone] ? ? ?04/21/2022 ?2:29 PM ? ?

## 2022-04-21 NOTE — Anesthesia Procedure Notes (Signed)
Procedure Name: Intubation ?Date/Time: 04/21/2022 3:59 PM ?Performed by: Lind Covert, CRNA ?Pre-anesthesia Checklist: Patient identified, Emergency Drugs available, Suction available and Patient being monitored ?Patient Re-evaluated:Patient Re-evaluated prior to induction ?Oxygen Delivery Method: Circle system utilized ?Preoxygenation: Pre-oxygenation with 100% oxygen ?Induction Type: IV induction ?Ventilation: Mask ventilation without difficulty ?Laryngoscope Size: Mac and 3 ?Grade View: Grade I ?Tube type: Oral ?Tube size: 7.0 mm ?Number of attempts: 1 ?Airway Equipment and Method: Stylet ?Placement Confirmation: ETT inserted through vocal cords under direct vision, positive ETCO2 and breath sounds checked- equal and bilateral ?Secured at: 23 cm ?Tube secured with: Tape ?Dental Injury: Teeth and Oropharynx as per pre-operative assessment  ? ? ? ? ?

## 2022-04-21 NOTE — Transfer of Care (Signed)
Immediate Anesthesia Transfer of Care Note ? ?Patient: Meghan Burgess ? ?Procedure(s) Performed: IRRIGATION AND DEBRIDEMENT HIP (Right: Hip) ? ?Patient Location: PACU ? ?Anesthesia Type:General ? ?Level of Consciousness: drowsy ? ?Airway & Oxygen Therapy: Patient Spontanous Breathing ? ?Post-op Assessment: Report given to RN and Post -op Vital signs reviewed and stable ? ?Post vital signs: Reviewed and stable ? ?Last Vitals:  ?Vitals Value Taken Time  ?BP 145/91 04/21/22 1739  ?Temp    ?Pulse 90 04/21/22 1740  ?Resp 14 04/21/22 1741  ?SpO2 100 % 04/21/22 1740  ?Vitals shown include unvalidated device data. ? ?Last Pain:  ?Vitals:  ? 04/21/22 1302  ?TempSrc:   ?PainSc: 0-No pain  ?   ? ?Patients Stated Pain Goal: 3 (04/21/22 1302) ? ?Complications: No notable events documented. ?

## 2022-04-21 NOTE — Progress Notes (Signed)
Redness noted on patient's right arm, right wrist, bilateral upper legs, and lower abdomen. Patient states itching on right wrist only and thinks it is from CHG wipes. Will continue to monitor.  ?

## 2022-04-21 NOTE — Progress Notes (Signed)
Pharmacy Antibiotic Note ? ?Meghan Burgess is a 50 y.o. female admitted on 04/21/2022 with wound infection.  S/P debridement of skin and [place] tissue of right hip 04/21/22.  Patient's history include THA 03/17/2022.  Pharmacy has been consulted for Vancomycin and Zosyn dosing. ? ?Pre-Op patient received Vancomycin '1500mg'$  IV x 1 and Cefazolin 2gm IV x 1 ? ?Plan: ?Zosyn 3.375g IV q8h (4 hour infusion). ?Vancomycin 750 mg IV Q 12 hrs. Goal AUC 400-550.  Expected AUC: 464.9 SCr used: 0.8 (rounded up from 0.68). SCr from March 2023 used to empirically dose antibiotics.   ?BMet ordered for 5/4 AM and will confirm dosing when SCr resulted. ? ? ?Height: 5' 3.5" (161.3 cm) ?Weight: 100.7 kg (222 lb) ?IBW/kg (Calculated) : 53.55 ? ?Temp (24hrs), Avg:97.6 ?F (36.4 ?C), Min:97.4 ?F (36.3 ?C), Max:97.9 ?F (36.6 ?C) ? ?Recent Labs  ?Lab 04/21/22 ?1300  ?WBC 9.5  ?  ?CrCl cannot be calculated (Patient's most recent lab result is older than the maximum 21 days allowed.).   ? ?Allergies  ?Allergen Reactions  ? Naproxen Sodium Nausea And Vomiting  ?  GI upset  ? Zithromax [Azithromycin]   ?  GI upset  ? ? ?Antimicrobials this admission: ?5/3 Cefazolin x 1 ?5/3 Vancomycin >>   ?5/3 Zosyn >> ? ?Dose adjustments this admission: ?  ? ?Microbiology results: ?5/3 Wound Cx:   ?  ? ?Thank you for allowing pharmacy to be a part of this patient?s care. ? ?Everette Rank, PharmD ?04/21/2022 6:55 PM ? ?

## 2022-04-21 NOTE — Discharge Instructions (Addendum)
? ?Dr. Brian Swinteck ?Joint Replacement Specialist ?[facility] ?[address] ?[place], [place] [address] ?[phone] ? ? ?TOTAL HIP REPLACEMENT POSTOPERATIVE DIRECTIONS ? ? ? ?Hip Rehabilitation, Guidelines Following Surgery  ? ?WEIGHT BEARING ?Weight bearing as tolerated with assist device (walker, cane, etc) as directed, use it as long as suggested by your surgeon or therapist, typically at least 4-6 weeks. ? ?The results of a hip operation are greatly improved after range of motion and muscle strengthening exercises. Follow all safety measures which are given to protect your hip. If any of these exercises cause increased pain or swelling in your joint, decrease the amount until you are comfortable again. Then slowly increase the exercises. Call your caregiver if you have problems or questions.  ? ?HOME CARE INSTRUCTIONS  ?Most of the following instructions are designed to prevent the dislocation of your new hip.  ?Remove items at home which could result in a fall. This includes throw rugs or furniture in walking pathways.  ?Continue medications as instructed at time of discharge. ?You may have some home medications which will be placed on hold until you complete the course of blood thinner medication. ?You may start showering once you are discharged home. Do not remove your dressing. ?Do not put on socks or shoes without following the instructions of your caregivers.   ?Sit on chairs with arms. Use the chair arms to help push yourself up when arising.  ?Arrange for the use of a toilet seat elevator so you are not sitting low.  ?Walk with walker as instructed.  ?You may resume a sexual relationship in one month or when given the OK by your caregiver.  ?Use walker as long as suggested by your caregivers.  ?You may put full weight on your legs and walk as much as is comfortable. ?Avoid periods of inactivity such as sitting longer than an hour when not asleep. This helps prevent blood  clots.  ?You may return to work once you are cleared by your surgeon.  ?Do not drive a car for 6 weeks or until released by your surgeon.  ?Do not drive while taking narcotics.  ?Wear elastic stockings for two weeks following surgery during the day but you may remove then at night.  ?Make sure you keep all of your appointments after your operation with all of your doctors and caregivers. You should call the office at the above phone number and make an appointment for approximately two weeks after the date of your surgery. ?Please pick up a stool softener and laxative for home use as long as you are requiring pain medications. ?ICE to the affected hip every three hours for 30 minutes at a time and then as needed for pain and swelling. Continue to use ice on the hip for pain and swelling from surgery. You may notice swelling that will progress down to the foot and ankle.  This is normal after surgery.  Elevate the leg when you are not up walking on it.   ?It is important for you to complete the blood thinner medication as prescribed by your doctor. ?Continue to use the breathing machine which will help keep your temperature down.  It is common for your temperature to cycle up and down following surgery, especially at night when you are not up moving around and exerting yourself.  The breathing machine keeps your lungs expanded and your temperature down. ? ?RANGE OF MOTION AND STRENGTHENING EXERCISES  ?These exercises are designed to help you   keep full movement of your hip joint. Follow your caregiver's or physical therapist's instructions. Perform all exercises about fifteen times, three times per day or as directed. Exercise both hips, even if you have had only one joint replacement. These exercises can be done on a training (exercise) mat, on the floor, on a table or on a bed. Use whatever works the best and is most comfortable for you. Use music or television while you are exercising so that the exercises are a  pleasant break in your day. This will make your life better with the exercises acting as a break in routine you can look forward to.  ?Lying on your back, slowly slide your foot toward your buttocks, raising your knee up off the floor. Then slowly slide your foot back down until your leg is straight again.  ?Lying on your back spread your legs as far apart as you can without causing discomfort.  ?Lying on your side, raise your upper leg and foot straight up from the floor as far as is comfortable. Slowly lower the leg and repeat.  ?Lying on your back, tighten up the muscle in the front of your thigh (quadriceps muscles). You can do this by keeping your leg straight and trying to raise your heel off the floor. This helps strengthen the largest muscle supporting your knee.  ?Lying on your back, tighten up the muscles of your buttocks both with the legs straight and with the knee bent at a comfortable angle while keeping your heel on the floor.  ? ? ?POST-OPERATIVE OPIOID TAPER INSTRUCTIONS: ?It is important to wean off of your opioid medication as soon as possible. If you do not need pain medication after your surgery it is ok to stop day one. ?Opioids include: ?Codeine, Hydrocodone(Norco, Vicodin), Oxycodone(Percocet, oxycontin) and hydromorphone amongst others.  ?Long term and even short term use of opiods can cause: ?Increased pain response ?Dependence ?Constipation ?Depression ?Respiratory depression ?And more.  ?Withdrawal symptoms can include ?Flu like symptoms ?Nausea, vomiting ?And more ?Techniques to manage these symptoms ?Hydrate well ?Eat regular healthy meals ?Stay active ?Use relaxation techniques(deep breathing, meditating, yoga) ?Do Not substitute Alcohol to help with tapering ?If you have been on opioids for less than two weeks and do not have pain than it is ok to stop all together.  ?Plan to wean off of opioids ?This plan should start within one week post op of your joint replacement. ?Maintain the  same interval or time between taking each dose and first decrease the dose.  ?Cut the total daily intake of opioids by one tablet each day ?Next start to increase the time between doses. ?The last dose that should be eliminated is the evening dose.  ? ? ?MAKE SURE YOU:  ?Understand these instructions.  ?Will watch your condition.  ?Will get help right away if you are not doing well or get worse. ? ?Pick up stool softner and laxative for home use following surgery while on pain medications. ?Do not remove your dressing. ?Keep dressing clean and dry. ?Continue to use ice for pain and swelling after surgery. ?Do not use any lotions or creams on the incision until instructed by your surgeon. ?Total Hip Protocol. ?Empty out drain bulb every 4 hours or sooner if bulb is full.  ?Keep a daily log of how much fluid is coming out of your drain and bring to your appointment in 1 week.  ?Make sure you charge your Prevena wound vac nightly. This will stay on until  your appointment in 1 week. ? ? ?

## 2022-04-21 NOTE — Progress Notes (Signed)
No change in rash noted ; pt denies worsening of rash and states it looks better; no s/s of distress noted  ?

## 2022-04-21 NOTE — Op Note (Signed)
OPERATIVE REPORT ? ? ?04/21/2022 ? ?5:33 PM ? ?PATIENT:  Meghan Burgess  ? ?SURGEON:  Bertram Savin, MD ? ?ASSISTANT:  Larene Pickett, PA-C  ? ?PREOPERATIVE DIAGNOSIS:  Wound dehisensce right hip. ? ?POSTOPERATIVE DIAGNOSIS:  Same. ? ?PROCEDURE:  1. Excisional debridement of skin and subcutaneous tissue right hip. ?2. Closure of wound totaling 18 cm. ?3. Application of negative pressure incisional dressing. ? ?ANESTHESIA:   GETA. ? ?ANTIBIOTICS:  2 g Ancef. ?1 g Ancef. ? ?TUBES AND DRAINS: 1. Prevena negative pressure incisional dressing at 75 mmHg. ?2. 10 mm flat JP drain in subcutaneous tissue. ? ?IMPLANTS:  None. ? ?EXPLANTS:  None. ? ?SPECIMENS:  Right hip superficial wound swab for aerobic and anaerobic culture. ? ?COMPLICATIONS:  None. ? ?DISPOSITION:  Stable to PACU. ? ?SURGICAL INDICATIONS:  Meghan Burgess is a 51 y.o. female who underwent primary right total hip arthroplasty on 03/17/2022. Her hip replacement is doing very well, she has no groin pain.  She presented to the office last week with eschar on the incision compatible with delayed wound healing and cellulitis over the lateral thigh.  I placed her on oral doxycycline and saw her back earlier this week.  At that time, the lateral half of her surgical incision dehisced and she had a significant amount of necrotic subcutaneous fatty tissue.  A limited debridement was performed, a culture was obtained, and wet-to-dry packing commenced.  I also placed her on Bactrim DS at that point as well. She was indicated for surgical management. ? ?The risks, benefits, and alternatives were discussed with the patient preoperatively including but not limited to the risks of infection, bleeding, nerve / blood vessel injury, wound healing complications, cardiopulmonary complications, the need for repeat surgery, among others, and the patient was willing to proceed. ? ?PROCEDURE IN DETAIL: The patient was correctly identified in the preop holding area using 2  identifiers.  The surgical site was marked by myself.  She was taken to the operating room, and general anesthesia was induced on the stretcher.  She was then transferred to the [facility] table.  All bony prominences were well-padded.  The right hip was then prepped and draped in the normal sterile surgical fashion.  Timeout was called, verifying side and site of surgery.  She did receive IV antibiotics within 60 minutes of beginning the procedure. ? ?I began by examining the hip.  In the lateral aspect of the incision, there was a 13 cm x 2 cm area of dehisced skin.  At the far medial aspect of the incision, the were a 4 cmm area of superficial skin dehiscence. Using a #10 blade, I ellipsed the entire incision including the skin and subcutaneous tissue completely out.  There was no fluid collection in the wound bed. I sent a representative swab of the deep subcutaneous tissue for aerobic and anaerobic culture.  Rongeur and #10 blade were used to excisionally debride any questionable subcutaneous fatty tissue.  The wound was then copiously irrigated with normal saline.  I examined the fascia underneath.  There was healthy granulation tissue on top of the fascia.  The suture line was completely intact and covered in granulation tissue.  There were no rents in the fascia.  There is no fluid emanating from the hip joint even with gentle logrolling of the hip.  The fascia was therefore declared intact, as this situation is consistent with a superficial infection originating from the necrotic fatty tissue on top.  A total of 3  L of normal saline were irrigated in the wound with pulse lavage.  The wound was irrigated with Prontosan solution. Through a separate stab incision distally, a 10 mm flat JP drain was introduced into the subcutaneous tissue.  The deep dermal layer was closed with 2-0 interrupted Monocryl suture.  The skin was reapproximated with 2-0 nylon vertical mattress sutures and staples.  The skin was closed  without any undue tension.  The JP drain was sewn in with a 3-0 nylon suture.  Customizable Prevena negative pressure incisional wound dressing was applied according to manufacturer's instructions.  Suction was hooked up to 125 mmHg, and there was no leak. ? ?The patient was then extubated, and taken to the PACU in stable condition.  Sponge, needle, and instrument counts were correct at the end of the case x2.  There were no known complications. ?  ?POSTOPERATIVE PLAN: Postoperatively, the patient be admitted to the hospital.  He may weight-bear as tolerated with a walker.  Begin IV vancomycin and Zosyn for now.  Follow cultures. We will treat her for superficial skin structure infection with 2 weeks total of antibiotics.  Antibiotics will be tailored to the intraoperative culture.  We will plan for discharge when she is medically ready.  Upon discharge, the house wound VAC will be exchanged for a portable Prevena VAC.  We will likely discharge her with the JP drain depending on its output.  She will return to the office within 7 days of discharge for removal of the negative pressure incisional dressing. ? ? ?Debridement type: Excisional Debridement ? ?Side: right ? ?Body Location: hip  ? ?Tools used for debridement: scalpel and rongeur ? ?Pre-debridement Wound size (cm):   Length: 13        Width: 2     Depth: 3  ? ?Post-debridement Wound size (cm):   Length: 17        Width: 2     Depth: to fascia  ? ?Debridement depth beyond dead/damaged tissue down to healthy viable tissue: yes ? ?Tissue layer involved: skin, subcutaneous tissue ? ?Nature of tissue removed: Devitalized Tissue ? ?Irrigation volume: 3 L    ? ?Irrigation fluid type: Normal Saline, Prontosan ?

## 2022-04-21 NOTE — Anesthesia Postprocedure Evaluation (Signed)
Anesthesia Post Note ? ?Patient: Meghan Burgess ? ?Procedure(s) Performed: IRRIGATION AND DEBRIDEMENT HIP (Right: Hip) ? ?  ? ?Patient location during evaluation: PACU ?Anesthesia Type: General ?Level of consciousness: awake and alert ?Pain management: pain level controlled ?Vital Signs Assessment: post-procedure vital signs reviewed and stable ?Respiratory status: spontaneous breathing, nonlabored ventilation, respiratory function stable and patient connected to nasal cannula oxygen ?Cardiovascular status: blood pressure returned to baseline and stable ?Postop Assessment: no apparent nausea or vomiting ?Anesthetic complications: no ? ? ?No notable events documented. ? ?Last Vitals:  ?Vitals:  ? 04/21/22 1830 04/21/22 1941  ?BP: 130/68 (!) 147/96  ?Pulse: 82 79  ?Resp: 20 18  ?Temp: (!) 36.4 ?C 37.2 ?C  ?SpO2: 99% 95%  ?  ?Last Pain:  ?Vitals:  ? 04/21/22 1941  ?TempSrc: Oral  ?PainSc:   ? ? ?  ?  ?  ?  ?  ?  ? ?Sherril Shipman ? ? ? ? ?

## 2022-04-22 ENCOUNTER — Encounter (HOSPITAL_COMMUNITY): Payer: Self-pay | Admitting: Orthopedic Surgery

## 2022-04-22 LAB — BASIC METABOLIC PANEL
Anion gap: 7 (ref 5–15)
BUN: 13 mg/dL (ref 6–20)
CO2: 24 mmol/L (ref 22–32)
Calcium: 8.7 mg/dL — ABNORMAL LOW (ref 8.9–10.3)
Chloride: 108 mmol/L (ref 98–111)
Creatinine, Ser: 0.7 mg/dL (ref 0.44–1.00)
GFR, Estimated: >60 mL/min (ref >60)
Glucose, Bld: 132 mg/dL — ABNORMAL HIGH (ref 70–99)
Potassium: 3.9 mmol/L (ref 3.5–5.1)
Sodium: 139 mmol/L (ref 135–145)

## 2022-04-22 LAB — CBC
HCT: 32.2 % — ABNORMAL LOW (ref 36.0–46.0)
Hemoglobin: 10.7 g/dL — ABNORMAL LOW (ref 12.0–15.0)
MCH: 29.9 pg (ref 26.0–34.0)
MCHC: 33.2 g/dL (ref 30.0–36.0)
MCV: 89.9 fL (ref 80.0–100.0)
Platelets: 399 10*3/uL (ref 150–400)
RBC: 3.58 MIL/uL — ABNORMAL LOW (ref 3.87–5.11)
RDW: 13.5 % (ref 11.5–15.5)
WBC: 9.6 10*3/uL (ref 4.0–10.5)
nRBC: 0 % (ref 0.0–0.2)

## 2022-04-22 MED ORDER — CEFAZOLIN SODIUM-DEXTROSE 2-4 GM/100ML-% IV SOLN
2.0000 g | Freq: Three times a day (TID) | INTRAVENOUS | Status: DC
  Administered 2022-04-22 – 2022-04-23 (×3): 2 g via INTRAVENOUS
  Filled 2022-04-22 (×3): qty 100

## 2022-04-22 MED ORDER — MELOXICAM 15 MG PO TABS
15.0000 mg | ORAL_TABLET | Freq: Every day | ORAL | Status: DC
  Administered 2022-04-22 – 2022-04-24 (×3): 15 mg via ORAL
  Filled 2022-04-22 (×3): qty 1

## 2022-04-22 NOTE — Evaluation (Signed)
Physical Therapy Evaluation ?Patient Details ?Name: Meghan Burgess ?MRN: [id number] ?DOB: [birth year] ?Today's Date: 04/22/2022 ? ?History of Present Illness ? 51 y.o. female admitted 04/21/22 with surgical wound dehiscence R hip, s/p debridement and closure with placement of wound VAC and JP drain. PMH: R THA 03/17/22, cervical cancer, GERD.  ?Clinical Impression ? Pt is mobilizing well. She ambulated 260' without an assistive device, no loss of balance. We reviewed her THA HEP, she demonstrates good understanding. She is ready to DC home from a PT standpoint. I encouraged her to ambulate in halls TID independently. PT signing off as pt is independent with mobility.    ?   ? ?Recommendations for follow up therapy are one component of a multi-disciplinary discharge planning process, led by the attending physician.  Recommendations may be updated based on patient status, additional functional criteria and insurance authorization. ? ?Follow Up Recommendations No PT follow up ? ?  ?Assistance Recommended at Discharge Set up Supervision/Assistance  ?Patient can return home with the following ? Assistance with cooking/housework;Assist for transportation ? ?  ?Equipment Recommendations None recommended by PT  ?Recommendations for Other Services ?    ?  ?Functional Status Assessment Patient has not had a recent decline in their functional status  ? ?  ?Precautions / Restrictions Precautions ?Precautions: Fall;Other (comment) ?Precaution Comments: wound VAC, JP drain L hip ?Restrictions ?Weight Bearing Restrictions: No ?Other Position/Activity Restrictions: WBAT  ? ?  ? ?Mobility ? Bed Mobility ?Overal bed mobility: Independent ?Bed Mobility: Supine to Sit ?  ?  ?Supine to sit: Independent ?  ?  ?  ?  ? ?Transfers ?Overall transfer level: Needs assistance ?Equipment used: 1 person hand held assist ?Transfers: Sit to/from Stand ?Sit to Stand: Min guard ?  ?  ?  ?  ?  ?General transfer comment: no physical assist ?   ? ?Ambulation/Gait ?Ambulation/Gait assistance: Independent ?Gait Distance (Feet): 260 Feet ?Assistive device: None ?Gait Pattern/deviations: WFL(Within Functional Limits) ?Gait velocity: WFL ?  ?  ?General Gait Details: steady without AD, no loss of balance ? ?Stairs ?  ?  ?  ?  ?  ? ?Wheelchair Mobility ?  ? ?Modified Rankin (Stroke Patients Only) ?  ? ?  ? ?Balance Overall balance assessment: Independent ?  ?  ?  ?  ?  ?  ?  ?  ?  ?  ?  ?  ?  ?  ?  ?  ?  ?  ?   ? ? ? ?Pertinent Vitals/Pain Pain Assessment ?Pain Score: 3  ?Pain Location: R hip at VAC/drain sites ?Pain Descriptors / Indicators: Discomfort ?Pain Intervention(s): Limited activity within patient's tolerance, Monitored during session, Premedicated before session, Repositioned  ? ? ?Home Living Family/patient expects to be discharged to:: Private residence ?Living Arrangements: Spouse/significant other (Husband Ulice Dash) ?Available Help at Discharge: Family;Available 24 hours/day ?Type of Home: House ?Home Access: Stairs to enter ?Entrance Stairs-Rails: Can reach both;Left;Right ?Entrance Stairs-Number of Steps: 5 ?  ?Home Layout: One level;Laundry or work area in basement ?Home Equipment: Conservation officer, nature (2 wheels);Cane - single point ?   ?  ?Prior Function Prior Level of Function : Independent/Modified Independent ?  ?  ?  ?  ?  ?  ?Mobility Comments: IND ?ADLs Comments: IND ?  ? ? ?Hand Dominance  ?   ? ?  ?Extremity/Trunk Assessment  ? Upper Extremity Assessment ?Upper Extremity Assessment: Overall WFL for tasks assessed ?  ? ?Lower Extremity Assessment ?Lower Extremity Assessment: Overall [facility]  for tasks assessed ?RLE Sensation: WNL ?  ? ?Cervical / Trunk Assessment ?Cervical / Trunk Assessment: Normal  ?Communication  ? Communication: No difficulties  ?Cognition Arousal/Alertness: Awake/alert ?Behavior During Therapy: [facility] for tasks assessed/performed ?Overall Cognitive Status: Within Functional Limits for tasks assessed ?  ?  ?  ?  ?  ?  ?  ?  ?  ?  ?  ?   ?  ?  ?  ?  ?  ?  ?  ? ?  ?General Comments   ? ?  ?Exercises Total Joint Exercises ?Ankle Circles/Pumps: AROM, Both, 10 reps, Seated ?Long Arc Quad: AROM, Right, 10 reps  ? ?Assessment/Plan  ?  ?PT Assessment Patient does not need any further PT services  ?PT Problem List   ? ?   ?  ?PT Treatment Interventions     ? ?PT Goals (Current goals can be found in the Care Plan section)  ?Acute Rehab PT Goals ?Patient Stated Goal: to go home ?PT Goal Formulation: All assessment and education complete, DC therapy ? ?  ?Frequency   ?  ? ? ?Co-evaluation   ?  ?  ?  ?  ? ? ?  ?AM-PAC PT "6 Clicks" Mobility  ?Outcome Measure Help needed turning from your back to your side while in a flat bed without using bedrails?: None ?Help needed moving from lying on your back to sitting on the side of a flat bed without using bedrails?: None ?Help needed moving to and from a bed to a chair (including a wheelchair)?: None ?Help needed standing up from a chair using your arms (e.g., wheelchair or bedside chair)?: None ?Help needed to walk in hospital room?: None ?Help needed climbing 3-5 steps with a railing? : None ?6 Click Score: 24 ? ?  ?End of Session Equipment Utilized During Treatment: Gait belt ?Activity Tolerance: Patient tolerated treatment well ?Patient left: with call bell/phone within reach;with nursing/sitter in room;in chair ?Nurse Communication: Mobility status ?PT Visit Diagnosis: Pain;Difficulty in walking, not elsewhere classified (R26.2) ?Pain - Right/Left: Right ?Pain - part of body: Hip ?  ? ?Time: 4801-6553 ?PT Time Calculation (min) (ACUTE ONLY): 12 min ? ? ?Charges:   PT Evaluation ?$PT Eval Low Complexity: 1 Low ?  ?  ?   ? ?Blondell Reveal Kistler PT 04/22/2022  ?Acute Rehabilitation Services ?Pager [phone] ?Office [phone] ? ? ?

## 2022-04-22 NOTE — TOC Initial Note (Signed)
Transition of Care (TOC) - Initial/Assessment Note  ? ?Patient Details  ?Name: Meghan Burgess ?MRN: [id number] ?Date of Birth: 02-12-1971 ? ?Transition of Care (TOC) CM/SW Contact:    ?Sherie Don, LCSW ?Phone Number: ?04/22/2022, 9:55 AM ? ?Clinical Narrative: CSW met with patient to review discharge plan and needs. Patient will go home with a home exercise program (HEP) and already has a rolling walker at home. Patient may need to discharge home on IV antibiotics depending on the cultures, so patient was agreeable to a tentative referral to [facility]. CSW made referral to [facility] with [facility]. TOC awaiting culture results. ? ?Expected Discharge Plan: Home/Self Care ?Barriers to Discharge: Continued Medical Work up ? ?Patient Goals and CMS Choice ?Patient states their goals for this hospitalization and ongoing recovery are:: Discharge home with HEP ?CMS Medicare.gov Compare Post Acute Care list provided to:: Patient ?Choice offered to / list presented to : Patient ? ?Expected Discharge Plan and Services ?Expected Discharge Plan: Home/Self Care ?In-house Referral: Clinical Social Work ?Post Acute Care Choice: Home Health ?Living arrangements for the past 2 months: [place]           ?DME Arranged: N/A ?DME Agency: NA ?HH Arranged: IV Antibiotics ?[place] Agency: [facility] ?Date HH Agency Contacted: 04/22/22 ?Representative spoke with at [place]: [place] ? ?Prior Living Arrangements/Services ?Living arrangements for the past 2 months: [place] ?Lives with:: Spouse ?Patient language and need for interpreter reviewed:: Yes ?Do you feel safe going back to the place where you live?: Yes      ?Need for Family Participation in Patient Care: No (Comment) ?Care giver support system in place?: Yes (comment) ?Current home services: DME (Rolling walker, cane) ?Criminal Activity/Legal Involvement Pertinent to Current Situation/Hospitalization: No - Comment as needed ? ?Activities of Daily Living ?Home Assistive  Devices/Equipment: Eyeglasses, Gilford Rile (specify type), Blood pressure cuff, Cane (specify quad or straight) ?ADL Screening (condition at time of admission) ?Patient's cognitive ability adequate to safely complete daily activities?: Yes ?Is the patient deaf or have difficulty hearing?: No ?Does the patient have difficulty seeing, even when wearing glasses/contacts?: No ?Does the patient have difficulty concentrating, remembering, or making decisions?: No ?Patient able to express need for assistance with ADLs?: Yes ?Does the patient have difficulty dressing or bathing?: No ?Independently performs ADLs?: Yes (appropriate for developmental age) ?Does the patient have difficulty walking or climbing stairs?: Yes (Hip pain) ?Weakness of Legs: None ?Weakness of Arms/Hands: None ? ?Permission Sought/Granted ?Permission sought to share information with : Other (comment) ?Permission granted to share information with : Yes, Verbal Permission Granted ?Permission granted to share info w AGENCY: [facility], [facility] agencies ? ?Emotional Assessment ?Appearance:: Appears stated age ?Attitude/Demeanor/Rapport: Engaged ?Affect (typically observed): Accepting ?Orientation: : Oriented to Self, Oriented to Place, Oriented to  Time, Oriented to Situation ?Psych Involvement: No (comment) ? ?Admission diagnosis:  Surgical wound dehiscence [T81.31XA] ?Patient Active Problem List  ? Diagnosis Date Noted  ? Surgical wound dehiscence 04/21/2022  ? Osteoarthritis of right hip 03/17/2022  ? 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 Cervix cancer ([place]) 12/17/2011  ? ?PCP:  Elie Confer, NP ?Pharmacy:   ?[place], [place] ?[place] ?[place] [address] ?Phone: [phone] Fax: [phone] ? ?Readmission Risk Interventions ?   ? View : No data to display.  ?  ?  ?  ? ?

## 2022-04-22 NOTE — Progress Notes (Signed)
Pharmacy Antibiotic Note ? ?Meghan Burgess is a 51 y.o. female s/p right THA on 03/17/22 and subsequently developed cellulitis/wound infection at surgical site. She did not improve while on doxycycline/bactrim outpatient.  She was readmitted on 04/21/22 and underwent I&D of right hip wound.  Vancomycin and zosyn started post-op on 04/21/22. Pharmacy has been consulted on 04/22/22 to change zosyn to ancef. ? ?Today, 04/22/2022: ?- day #2 abx ?- afeb, wbc wnl ?- scr 0.70 (crcl~97) ?- wound culture remains neg thus far ? ?Plan: ?- ancef 2gm q8h. With good renal function, pharmacy will sign off for ancef consult ?- continue vancomycin 750 mg q12h ? ?________________________________________ ? ?Height: 5' 3.5" (161.3 cm) ?Weight: 100.7 kg (222 lb) ?IBW/kg (Calculated) : 53.55 ? ?Temp (24hrs), Avg:98.1 ?F (36.7 ?C), Min:97.4 ?F (36.3 ?C), Max:98.9 ?F (37.2 ?C) ? ?Recent Labs  ?Lab 04/21/22 ?1300 04/22/22 ?0327  ?WBC 9.5 9.6  ?CREATININE  --  0.70  ?  ?Estimated Creatinine Clearance: 97.2 mL/min (by C-G formula based on SCr of 0.7 mg/dL).   ? ?Allergies  ?Allergen Reactions  ? Naproxen Sodium Nausea And Vomiting  ?  GI upset  ? Zithromax [Azithromycin]   ?  GI upset  ? ? ?Thank you for allowing pharmacy to be a part of this patient?s care. ? ?Meghan Burgess ?04/22/2022 7:50 AM ? ?

## 2022-04-22 NOTE — Progress Notes (Signed)
? ? ?Subjective: ? ?Patient reports pain as mild.  Denies N/V/CP/SOB/Abd pain. Patient reports her pain more as just discomfort. She is doing really well. She has not needed much for pain.  ? ?Objective:  ? ?VITALS:   ?Vitals:  ? 04/21/22 1941 04/21/22 2248 04/22/22 7342 04/22/22 0631  ?BP: (!) 147/96 135/86 (!) 150/86 109/90  ?Pulse: 79 77 69 68  ?Resp: '18 18 18 18  '$ ?Temp: 98.9 ?F (37.2 ?C) 98.3 ?F (36.8 ?C) 98.3 ?F (36.8 ?C) 98.3 ?F (36.8 ?C)  ?TempSrc: Oral Oral Oral Oral  ?SpO2: 95% 94% 96% 96%  ?Weight:      ?Height:      ? ? ? ?Patient lying in bed. NAD ?Neurologically intact ?ABD soft ?Neurovascular intact ?Sensation intact distally ?Intact pulses distally ?Dorsiflexion/Plantar flexion intact ?No cellulitis present ?Compartment soft ?Prevena dressing with house vac 75 mmHg, no leaks, dressing intact.  ? ?Lab Results  ?Component Value Date  ? WBC 9.6 04/22/2022  ? HGB 10.7 (L) 04/22/2022  ? HCT 32.2 (L) 04/22/2022  ? MCV 89.9 04/22/2022  ? PLT 399 04/22/2022  ? ?BMET ?   ?Component Value Date/Time  ? NA 139 04/22/2022 0327  ? K 3.9 04/22/2022 0327  ? CL 108 04/22/2022 0327  ? CO2 24 04/22/2022 0327  ? GLUCOSE 132 (H) 04/22/2022 0327  ? BUN 13 04/22/2022 0327  ? CREATININE 0.70 04/22/2022 0327  ? CALCIUM 8.7 (L) 04/22/2022 0327  ? GFRNONAA >60 04/22/2022 0327  ? ?Results for orders placed or performed during the hospital encounter of 04/21/22  ?Aerobic/Anaerobic Culture w Gram Stain (surgical/deep wound)     Status: None (Preliminary result)  ? Collection Time: 04/21/22  4:45 PM  ? Specimen: Wound  ?Result Value Ref Range Status  ? Specimen Description   Final  ?  WOUND ?Performed at [facility], [place] [address], [place], [place] [address] ?  ? Special Requests   Final  ?  RIGHT HIP ?Performed at [facility], [place] [address] [place], [place] [address] ?  ? Gram Stain NO WBC SEEN ?NO ORGANISMS SEEN ?  Final  ? Culture   Final  ?  NO GROWTH < 24 HOURS ?Performed at [facility], [place] [address], [place], [place] [address] ?  ? Report Status PENDING  Incomplete  ? ? ?JP drain intact, drain output 90cc of SS fluid. ?Prevena dressing with house vac 75 mmHg, no leaks, dressing intact.  ?Hemoglobin stable 10.7. ? ?Assessment/Plan: ?1 Day Post-Op  ? ?Principal Problem: ?  Surgical wound dehiscence ? ?Currently awaiting culture results from office to determine abx regimen. Hospital culture results currently negative. Will continue to monitor. ?04/21/22 patient started on: ?- Pipercillin-tazobactam (Zosyn) 3.375g IV q8 hours ?- Vancomycin '750mg'$  IV q 12 hours ? ?04/22/22 ?Pharmacy consulted on 04/22/22 to change zosyn to ancef. Patient plan ancef 2g q8 hours and continue vancomycin '750mg'$  q12 hours. ? ?WBAT with walker ?DVT ppx: Aspirin, SCDs, TEDS ?PO pain control ?Dispo: Patient will d/c home with HEP. Will monitor JP drain output. Patient will probably d/c with JP drain in place. She currently has Prevena dressing in place with house vac unit. Nurse to convert house vac suction unit to portable Prevena unit at discharge. Patient will charge nightly.  ? ? ? ?[place] ?04/22/2022, 10:02 AM ? ? ?Rod Can, MD ?([phone] ?[place] [facility] [employer]  [facility] ?[address], [address], [place], [place] [address] ?Phone: [phone] ?  [email or web address] ?Facebook  Engineer, structural  ?  ? ? ?

## 2022-04-22 NOTE — Plan of Care (Signed)
?  Problem: Health Behavior/Discharge Planning: ?Goal: Ability to manage health-related needs will improve ?Outcome: Progressing ?  ?Problem: Clinical Measurements: ?Goal: Ability to maintain clinical measurements within normal limits will improve ?Outcome: Progressing ?  ?Problem: Pain Managment: ?Goal: General experience of comfort will improve ?Outcome: Progressing ?  ?Problem: Safety: ?Goal: Ability to remain free from injury will improve ?Outcome: Progressing ?  ?Problem: Activity: ?Goal: Risk for activity intolerance will decrease ?Outcome: Progressing ?  ?

## 2022-04-23 LAB — CBC
HCT: 30.2 % — ABNORMAL LOW (ref 36.0–46.0)
Hemoglobin: 9.8 g/dL — ABNORMAL LOW (ref 12.0–15.0)
MCH: 29.8 pg (ref 26.0–34.0)
MCHC: 32.5 g/dL (ref 30.0–36.0)
MCV: 91.8 fL (ref 80.0–100.0)
Platelets: 366 10*3/uL (ref 150–400)
RBC: 3.29 MIL/uL — ABNORMAL LOW (ref 3.87–5.11)
RDW: 13.6 % (ref 11.5–15.5)
WBC: 7.2 10*3/uL (ref 4.0–10.5)
nRBC: 0 % (ref 0.0–0.2)

## 2022-04-23 MED ORDER — CIPROFLOXACIN IN D5W 400 MG/200ML IV SOLN
400.0000 mg | Freq: Two times a day (BID) | INTRAVENOUS | Status: DC
  Administered 2022-04-23 – 2022-04-24 (×3): 400 mg via INTRAVENOUS
  Filled 2022-04-23 (×3): qty 200

## 2022-04-23 NOTE — Progress Notes (Signed)
Pharmacy Antibiotic Note ? ?Meghan Burgess is a 51 y.o. female s/p right THA on 03/17/22 and subsequently developed cellulitis/wound infection at surgical site. She did not improve while on doxycycline/bactrim outpatient.  She was readmitted on 04/21/22 and underwent I&D of right hip wound.  Vancomycin and zosyn started post-op on 04/21/22. Pharmacy consulted on 04/22/22 to change zosyn to ancef. Consult received 5/5 to start ciprofloxacin. ? ?Today, 04/23/2022: ?- day #3 abx ?- afeb, wbc wnl ?- Superficial wound culture (right hip) with pseudomonas and staph aureus, pending sensitivities ?- Cefazolin discontinued ? ?Plan: ?- Start ciprofloxacin 400 mg IV q12h ?- Continue vancomycin 750 mg q12h ?- Follow up culture results for antibiotic de-escalation as indicated ? ?________________________________________ ? ?Height: 5' 3.5" (161.3 cm) ?Weight: 100.7 kg (222 lb) ?IBW/kg (Calculated) : 53.55 ? ?Temp (24hrs), Avg:98.1 ?F (36.7 ?C), Min:97.7 ?F (36.5 ?C), Max:98.9 ?F (37.2 ?C) ? ?Recent Labs  ?Lab 04/21/22 ?1300 04/22/22 ?0327 04/23/22 ?0331  ?WBC 9.5 9.6 7.2  ?CREATININE  --  0.70  --   ? ?  ?Estimated Creatinine Clearance: 97.2 mL/min (by C-G formula based on SCr of 0.7 mg/dL).   ? ?Allergies  ?Allergen Reactions  ? Naproxen Sodium Nausea And Vomiting  ?  GI upset  ? Zithromax [Azithromycin]   ?  GI upset  ? ? ?Thank you for allowing pharmacy to be a part of this patient?s care. ? ?Tawnya Crook, PharmD, BCPS ?Clinical Pharmacist ?04/23/2022 2:23 PM ? ? ?

## 2022-04-23 NOTE — Progress Notes (Signed)
? ? ?  Subjective: ? ?Patient reports pain as mild.  Denies N/V/CP/SOB/ABD pain. She just states that she feels discomfort where the negative pressure dressing is pulling and JP drain. Patient is eager to get home.  ? ?Objective:  ? ?VITALS:   ?Vitals:  ? 04/22/22 1616 04/22/22 2116 04/23/22 0151 04/23/22 0525  ?BP: (!) 141/88 131/68 108/64 116/72  ?Pulse: 80 74 64 66  ?Resp: '14 17 17 17  '$ ?Temp: 97.7 ?F (36.5 ?C) 98.2 ?F (36.8 ?C) 98 ?F (36.7 ?C) 98.3 ?F (36.8 ?C)  ?TempSrc:  Oral Oral Oral  ?SpO2: 98% 99% 93% 99%  ?Weight:      ?Height:      ? ? ?NAD. Sitting up in bed.  ?Neurologically intact ?ABD soft ?Neurovascular intact ?Sensation intact distally ?Intact pulses distally ?Dorsiflexion/Plantar flexion intact ?No cellulitis present ?Compartment soft ?Slight swelling in Rt upper thigh. Consistent with surgery.  ? ?Dressing/Drain: ?- JP drain intact, 42cc SS fluid. ?- Prevena negative pressure dressing intact, no leaks, hooked up to house vac suction unit at 17mHg.  ? ?Lab Results  ?Component Value Date  ? WBC 7.2 04/23/2022  ? HGB 9.8 (L) 04/23/2022  ? HCT 30.2 (L) 04/23/2022  ? MCV 91.8 04/23/2022  ? PLT 366 04/23/2022  ? ?BMET ?   ?Component Value Date/Time  ? NA 139 04/22/2022 0327  ? K 3.9 04/22/2022 0327  ? CL 108 04/22/2022 0327  ? CO2 24 04/22/2022 0327  ? GLUCOSE 132 (H) 04/22/2022 0327  ? BUN 13 04/22/2022 0327  ? CREATININE 0.70 04/22/2022 0327  ? CALCIUM 8.7 (L) 04/22/2022 0327  ? GFRNONAA >60 04/22/2022 0327  ? ?Results for orders placed or performed during the hospital encounter of 04/21/22  ?Aerobic/Anaerobic Culture w Gram Stain (surgical/deep wound)     Status: None (Preliminary result)  ? Collection Time: 04/21/22  4:45 PM  ? Specimen: Wound  ?Result Value Ref Range Status  ? Specimen Description   Final  ?  WOUND ?[facility] [address] [place] [address]?  ? Special Requests   Final  ?  RIGHT HIP ?[facility] [address], [place] [id number]?  ? Gram Stain   Final  ?  NO WBC SEEN ?NO ORGANISMS SEEN ?Performed at [facility] [address], [place] [address]?  ? Culture RARE GRAM NEGATIVE RODS  Final  ? Report Status PENDING  Incomplete  ? ?Culture results pending. Continue to follow. Will check office [facility] test results, still pending. ? ?Assessment/Plan: ?2 Days Post-Op  ? ?Principal Problem: ?  Surgical wound dehiscence ? ? ?WBAT with walker ?DVT ppx: Aspirin, SCDs, TEDS ?PO pain control: Hydrocodone ?PT/OT: Patient ambulated 260 feet with PT.  ?Dispo: Home when medically ready. Pending culture results to tailor antibiotics. Upon discharge, the house wound VAC will be exchanged for a portable Prevena VAC.  We will likely discharge her with the JP drain.  She will return to the office within 7 days of discharge for removal of the negative pressure incisional dressing. ? ? ? ?[place]?04/23/2022, 8:27 AM ? ? ?BRod Can MD ?([phone]?[facility] ?[address], [place] [address]?Phone: [phone]?[email or web address] ?Facebook  IEngineer, structural ?  ? ? ?

## 2022-04-23 NOTE — Plan of Care (Signed)

## 2022-04-24 LAB — CREATININE, SERUM
Creatinine, Ser: 0.67 mg/dL (ref 0.44–1.00)
GFR, Estimated: >60 mL/min (ref >60)

## 2022-04-24 MED ORDER — LEVOFLOXACIN 500 MG PO TABS: 500.0000 mg | ORAL_TABLET | Freq: Every day | ORAL | 0 refills | Status: AC

## 2022-04-24 NOTE — Plan of Care (Signed)
  Problem: Education: Goal: Knowledge of General Education information will improve Description: Including pain rating scale, medication(s)/side effects and non-pharmacologic comfort measures Outcome: Progressing   Problem: Activity: Goal: Risk for activity intolerance will decrease Outcome: Progressing   Problem: Pain Managment: Goal: General experience of comfort will improve Outcome: Progressing   

## 2022-04-24 NOTE — Progress Notes (Signed)
Switched patient to previna portable wound vac, patient verbalized understanding of instructions and portable wound vac.  ?

## 2022-04-24 NOTE — Progress Notes (Signed)
Patients culture results and susceptibilities reviewed from [facility].  ? ?Will start patient on Levofloxacin '500mg'$  po for 21 days.  ? ?Patient to be discharged home with prevena wound vac.  ? ? ?Larene Pickett, PA-C ?

## 2022-04-24 NOTE — Progress Notes (Signed)
Subjective: ?3 Days Post-Op Procedure(s) (LRB): ?IRRIGATION AND DEBRIDEMENT HIP (Right) ? ?Patient reports pain as mild to moderate.  Denies fever, chills, N/V, CP, SOB.  Tolerating POs well.  Admits to flatus.  Notes that she is ready to go home. ? ?Objective:  ? ?VITALS:  Temp:  [97.7 ?F (36.5 ?C)-98.3 ?F (36.8 ?C)] 97.9 ?F (36.6 ?C) (05/06 7062) ?Pulse Rate:  [62-89] 89 (05/06 0922) ?Resp:  [14-16] 14 (05/06 3762) ?BP: (126-139)/(76-92) 134/83 (05/06 8315) ?SpO2:  [97 %-99 %] 98 % (05/06 0922) ? ?General: WDWN patient in NAD. ?Psych:  Appropriate mood and affect. ?Neuro:  A&O x 3, Moving all extremities, sensation intact to light touch ?HEENT:  EOMs intact ?Chest:  Even non-labored respirations ?Skin:  Dressing C/D/I, no rashes or lesions.  JP and hemovac intact.  Scant serosanguinous drainage in JP drain.  No drainage in hemovac. ?Extremities: warm/dry, mild edema to R hip, no erythema or echymosis.  No lymphadenopathy. ?Pulses: Popliteus 2+ ?MSK:  ROM: TKE, MMT: able to perform quad set ? ? ?LABS ?Recent Labs  ?  04/21/22 ?1300 04/22/22 ?0327 04/23/22 ?0331  ?HGB 12.3 10.7* 9.8*  ?WBC 9.5 9.6 7.2  ?PLT 411* 399 366  ? ?Recent Labs  ?  04/22/22 ?0327 04/24/22 ?0332  ?NA 139  --   ?K 3.9  --   ?CL 108  --   ?CO2 24  --   ?BUN 13  --   ?CREATININE 0.70 0.67  ?GLUCOSE 132*  --   ? ?No results for input(s): LABPT, INR in the last 72 hours. ? ? ?Assessment/Plan: ?3 Days Post-Op Procedure(s) (LRB): ?IRRIGATION AND DEBRIDEMENT HIP (Right) ? ?Patient seen in rounds for Dr. Lyla Glassing ?JP drain removed.  Dry dressing applied. ?WBAT R LE with walker ?Up with therapy ?DVT ppx:  ASA '81mg'$  BID ?Awaiting culture sensativities for D/C ABX.  Once D/C ABX are confirmed patient may be D/C'd home with transition to portable prevena wound vac. ?Please inform me if patient is ready for D/C today and I will place order. ?Plan for 7 day outpatient post-op visit with Dr. Lyla Glassing. ? ?Mechele Claude PA-C ?[facility] ?Office:  [phone]   ?

## 2022-04-26 NOTE — Discharge Summary (Addendum)
Physician Discharge Summary  ?Patient ID: ?Meghan Burgess ?MRN: [id number] ?DOB/AGE: 1971/01/31 51 y.o. ? ?Admit date: 04/21/2022 ?Discharge date: 04/26/2022 ? ?Admission Diagnoses:  ?Surgical wound dehiscence ? ?Discharge Diagnoses:  ?Principal Problem: ?  Surgical wound dehiscence ? ? ?Past Medical History:  ?Diagnosis Date  ? Cervical cancer ([place])   ? adenocarcinoma of cervix  ? GERD (gastroesophageal reflux disease)   ? Headache   ? migraines  ? PONV (postoperative nausea and vomiting)   ? ? ?Surgeries: Procedure(s): ?IRRIGATION AND DEBRIDEMENT HIP on 04/21/2022 ?  ?Consultants (if any):  ? ?Discharged Condition: Improved ? ?Hospital Course: Meghan Burgess is a 51 y.o. female who underwent primary right total hip arthroplasty on 03/17/2022. Her hip replacement is doing very well, she has no groin pain.  She presented to the office last week with eschar on the incision compatible with delayed wound healing and cellulitis over the lateral thigh.  I placed her on oral doxycycline and saw her back earlier this week.  At that time, the lateral half of her surgical incision dehisced and she had a significant amount of necrotic subcutaneous fatty tissue.  A limited debridement was performed, a culture was obtained, and wet-to-dry packing commenced.  I also placed her on Bactrim DS at that point as well. She was indicated for surgical management. ? ?She was given perioperative antibiotics:  ?Anti-infectives (From admission, onward)  ? ? Start     Dose/Rate Route Frequency Ordered Stop  ? 04/24/22 0000  levofloxacin (LEVAQUIN) 500 MG tablet       ? 500 mg Oral Daily 04/24/22 1103 05/15/22 2359  ? 04/23/22 0900  ciprofloxacin (CIPRO) IVPB 400 mg  Status:  Discontinued       ? 400 mg ?200 mL/hr over 60 Minutes Intravenous Every 12 hours 04/23/22 0814 04/24/22 1723  ? 04/22/22 1200  ceFAZolin (ANCEF) IVPB 2g/100 mL premix  Status:  Discontinued       ? 2 g ?200 mL/hr over 30 Minutes Intravenous Every 8 hours 04/22/22 0811 04/23/22  1220  ? 04/22/22 0600  ceFAZolin (ANCEF) IVPB 2g/100 mL premix       ? 2 g ?200 mL/hr over 30 Minutes Intravenous On call to O.R. 04/21/22 1243 04/22/22 9024  ? 04/22/22 0600  vancomycin (VANCOREADY) IVPB 1500 mg/300 mL       ? 1,500 mg ?150 mL/hr over 120 Minutes Intravenous On call to O.R. 04/21/22 1243 04/22/22 0744  ? 04/22/22 0200  vancomycin (VANCOREADY) IVPB 750 mg/150 mL  Status:  Discontinued       ? 750 mg ?150 mL/hr over 60 Minutes Intravenous Every 12 hours 04/21/22 1854 04/24/22 1723  ? 04/21/22 2000  piperacillin-tazobactam (ZOSYN) IVPB 3.375 g  Status:  Discontinued       ? 3.375 g ?12.5 mL/hr over 240 Minutes Intravenous Every 8 hours 04/21/22 1852 04/22/22 0811  ? ?  ? ? ?She was given sequential compression devices, early ambulation, and Aspirin for DVT prophylaxis. ? ?POD#1, 2. Patient's JP drain was monitored for output. She was on a Prevena negative pressure dressing connected to a house vac unit with 43mHg. Pharmacy consulted on 04/22/22 to change zosyn to ancef. Patient plan ancef 2g q8 hours and continue vancomycin '750mg'$  q12 hours.  ?POD#3 Patients JP drain was removed. Her cultures came back from [facility] and she was prescribed Levofloxacin '500mg'$  po daily for 21 days per sensitivities. Nurse converted her house vac suction unit to portable Prevena unit at discharge. Patient to  charge nightly.  ? ?Patient was kept in hospital awaiting culture results from [facility] and from intraoperative cultures until resulted on POD#3.  ? ?Patient ambulated with PT throughout hospital stay with no issues.  ? ?She benefited maximally from the hospital stay and there were no complications.   ? ?Recent vital signs:  ?Vitals:  ? 04/24/22 0513 04/24/22 0922  ?BP: 128/77 134/83  ?Pulse: 62 89  ?Resp: 16 14  ?Temp: 98 ?F (36.7 ?C) 97.9 ?F (36.6 ?C)  ?SpO2: 97% 98%  ? ? ?Recent laboratory studies:  ?Lab Results  ?Component Value Date  ? HGB 9.8 (L) 04/23/2022  ? HGB 10.7 (L) 04/22/2022  ? HGB 12.3  04/21/2022  ? ?Lab Results  ?Component Value Date  ? WBC 7.2 04/23/2022  ? PLT 366 04/23/2022  ? ?No results found for: INR ?Lab Results  ?Component Value Date  ? NA 139 04/22/2022  ? K 3.9 04/22/2022  ? CL 108 04/22/2022  ? CO2 24 04/22/2022  ? BUN 13 04/22/2022  ? CREATININE 0.67 04/24/2022  ? GLUCOSE 132 (H) 04/22/2022  ? ?Results for orders placed or performed during the hospital encounter of 04/21/22  ?Aerobic/Anaerobic Culture w Gram Stain (surgical/deep wound)     Status: None (Preliminary result)  ? Collection Time: 04/21/22  4:45 PM  ? Specimen: Wound  ?Result Value Ref Range Status  ? Specimen Description   Final  ?  WOUND ?Performed at [facility], [place] [address] [place], [place] [address] ?  ? Special Requests   Final  ?  RIGHT HIP ?Performed at [facility], [place] [address] [place], [place] [address] ?  ? Gram Stain   Final  ?  NO WBC SEEN ?NO ORGANISMS SEEN ?Performed at [facility], [place] [address] [place], [place] [address] ?  ? Culture   Final  ?  RARE PSEUDOMONAS AERUGINOSA ?RARE STAPHYLOCOCCUS AUREUS ?NO ANAEROBES ISOLATED; CULTURE IN PROGRESS FOR 5 DAYS ?  ? Report Status PENDING  Incomplete  ? Organism ID, Bacteria PSEUDOMONAS AERUGINOSA  Final  ? Organism ID, Bacteria STAPHYLOCOCCUS AUREUS  Final  ?    Susceptibility  ? Pseudomonas aeruginosa - MIC*  ?  CEFTAZIDIME <=1 SENSITIVE Sensitive   ?  CIPROFLOXACIN <=0.25 SENSITIVE Sensitive   ?  GENTAMICIN <=1 SENSITIVE Sensitive   ?  IMIPENEM 2 SENSITIVE Sensitive   ?  PIP/TAZO <=4 SENSITIVE Sensitive   ?  CEFEPIME 0.5 SENSITIVE Sensitive   ?  * RARE PSEUDOMONAS AERUGINOSA  ? Staphylococcus aureus - MIC*  ?  CIPROFLOXACIN <=0.5 SENSITIVE Sensitive   ?  ERYTHROMYCIN <=0.25 SENSITIVE Sensitive   ?  GENTAMICIN <=0.5 SENSITIVE Sensitive   ?  OXACILLIN 0.5 SENSITIVE Sensitive   ?  TETRACYCLINE <=1 SENSITIVE Sensitive   ?  VANCOMYCIN <=0.5 SENSITIVE Sensitive   ?  TRIMETH/SULFA <=10 SENSITIVE Sensitive   ?   CLINDAMYCIN <=0.25 SENSITIVE Sensitive   ?  RIFAMPIN <=0.5 SENSITIVE Sensitive   ?  Inducible Clindamycin NEGATIVE Sensitive   ?  * RARE STAPHYLOCOCCUS AUREUS  ? ? ? ? ?Allergies as of 04/24/2022   ? ?   Reactions  ? Naproxen Sodium Nausea And Vomiting  ? GI upset  ? Zithromax [azithromycin]   ? GI upset  ? ?  ? ?  ?Medication List  ?  ? ?STOP taking these medications   ? ?doxycycline 100 MG capsule ?Commonly known as: MONODOX ?  ?sulfamethoxazole-trimethoprim 800-160 MG tablet ?Commonly known as:  BACTRIM DS ?  ? ?  ? ?TAKE these medications   ? ?acetaminophen 500 MG tablet ?Commonly known as: TYLENOL ?Take 500 mg by mouth every 6 (six) hours as needed for moderate pain, fever or mild pain. ?  ?aspirin 81 MG chewable tablet ?Commonly known as: Aspirin Childrens ?Chew 1 tablet (81 mg total) by mouth 2 (two) times daily with a meal. ?  ?docusate sodium 100 MG capsule ?Commonly known as: Colace ?Take 1 capsule (100 mg total) by mouth 2 (two) times daily. ?  ?famotidine 40 MG tablet ?Commonly known as: PEPCID ?Take 1 tablet (40 mg total) by mouth every evening. ?  ?HYDROcodone-acetaminophen 5-325 MG tablet ?Commonly known as: Norco ?Take 1 tablet by mouth every 4 (four) hours as needed for moderate pain. ?  ?levofloxacin 500 MG tablet ?Commonly known as: Levaquin ?Take 1 tablet (500 mg total) by mouth daily for 21 days. ?  ?meloxicam 15 MG tablet ?Commonly known as: MOBIC ?Take 1 tablet (15 mg total) by mouth daily. ?  ?methocarbamol 500 MG tablet ?Commonly known as: Robaxin ?Take 1 tablet (500 mg total) by mouth every 6 (six) hours as needed for muscle spasms. ?  ?ondansetron 4 MG tablet ?Commonly known as: Zofran ?Take 1 tablet (4 mg total) by mouth every 8 (eight) hours as needed for nausea or vomiting. ?  ?phenylephrine 10 MG Tabs tablet ?Commonly known as: SUDAFED PE ?Take 10 mg by mouth at bedtime as needed (congestion). ?  ?polyethylene glycol 17 g packet ?Commonly known as: MiraLax ?Take 17 g by mouth daily as  needed. ?What changed: reasons to take this ?  ?PROBIOTIC PO ?Take 1 tablet by mouth daily. ?  ?senna 8.6 MG Tabs tablet ?Commonly known as: SENOKOT ?Take 2 tablets (17.2 mg total) by mouth at bedtime. ?Wh

## 2022-04-27 LAB — AEROBIC/ANAEROBIC CULTURE W GRAM STAIN (SURGICAL/DEEP WOUND): Gram Stain: NONE SEEN

## 2022-10-11 ENCOUNTER — Ambulatory Visit: Payer: PRIVATE HEALTH INSURANCE | Admitting: Podiatry

## 2022-10-11 DIAGNOSIS — M6740 Ganglion, unspecified site: Secondary | ICD-10-CM | POA: Diagnosis not present

## 2022-10-11 NOTE — Progress Notes (Signed)
  Subjective:  Patient ID: Meghan Burgess, female    DOB: [birth year],  MRN: [id number]  Chief Complaint  Patient presents with   Nail Problem    Growth on 2nd toe on right foot. Patient stated it is not painful and it has decrease in size since May 2023.     51 y.o. female presents with concern for prior growth on the second toe of the right foot.  She states she had a clear blister that was present on the top of the right second toe until this past weekend when the blister popped and some clear fluid drained out.  She denies any redness or swelling of the right second toe.  She denies area being painful at this time.  She really wanted to come in and get it checked out to make sure that nothing need to be done for this spot.  Past Medical History:  Diagnosis Date   Cervical cancer ([facility])    adenocarcinoma of cervix   GERD (gastroesophageal reflux disease)    Headache    migraines   PONV (postoperative nausea and vomiting)     Allergies  Allergen Reactions   Naproxen Sodium Nausea And Vomiting    GI upset   Zithromax [Azithromycin]     GI upset    ROS: Negative except as per HPI above  Objective:  General: AAO x3, NAD  Dermatological: R 2nd toe drained mucoid cyst at DIPJ with eschar at site of prior cyst. No erythema or drainage or cystic fluid. No pain on palpation.   Vascular:  Dorsalis Pedis artery and Posterior Tibial artery pedal pulses are 2/4 bilateral.  Capillary fill time < 3 sec to all digits.   Neruologic: Grossly intact via light touch bilateral. Protective threshold intact to all sites bilateral.   Musculoskeletal: No gross boney pedal deformities bilateral. No pain, crepitus, or limitation noted with foot and ankle range of motion bilateral. Muscular strength 5/5 in all groups tested bilateral.  Gait: Unassisted, Nonantalgic.   No images are attached to the encounter.  Assessment:   1. Mucoid cyst of joint      Plan:  Patient was evaluated and treated  and all questions answered.  #Mucoid cyst of second toe distal interphalangeal joint right foot -Discussed with the patient that she likely had a mucoid cyst present at the distal interphalangeal joint of the right second toe. -This has spontaneously drained after being ruptured. -Discussed that this may recur and if it does can be left intact if not causing pain -If the area is causing pain can consider surgical correction to include excision of the cyst with distal interphalangeal joint arthroplasty as needed. -Patient states not currently bothering her and she will continue to monitor the area for any signs of redness swelling or drainage or reformation of the cyst -We will follow-up as needed if desires further treatment of this area.  Return if symptoms worsen or fail to improve.          Everitt Amber, DPM [facility]

## 2023-06-05 IMAGING — DX DG PORTABLE PELVIS
2 series · 2 of 2 positions shown · non-contrast
Comparison: Intraoperative radiographs obtained the same day CT
abdomen/pelvis 02/23/2016

CLINICAL DATA: Status post hip surgery

EXAM:
PORTABLE PELVIS 1-2 VIEWS

[pelvis ap (1 of 2)]
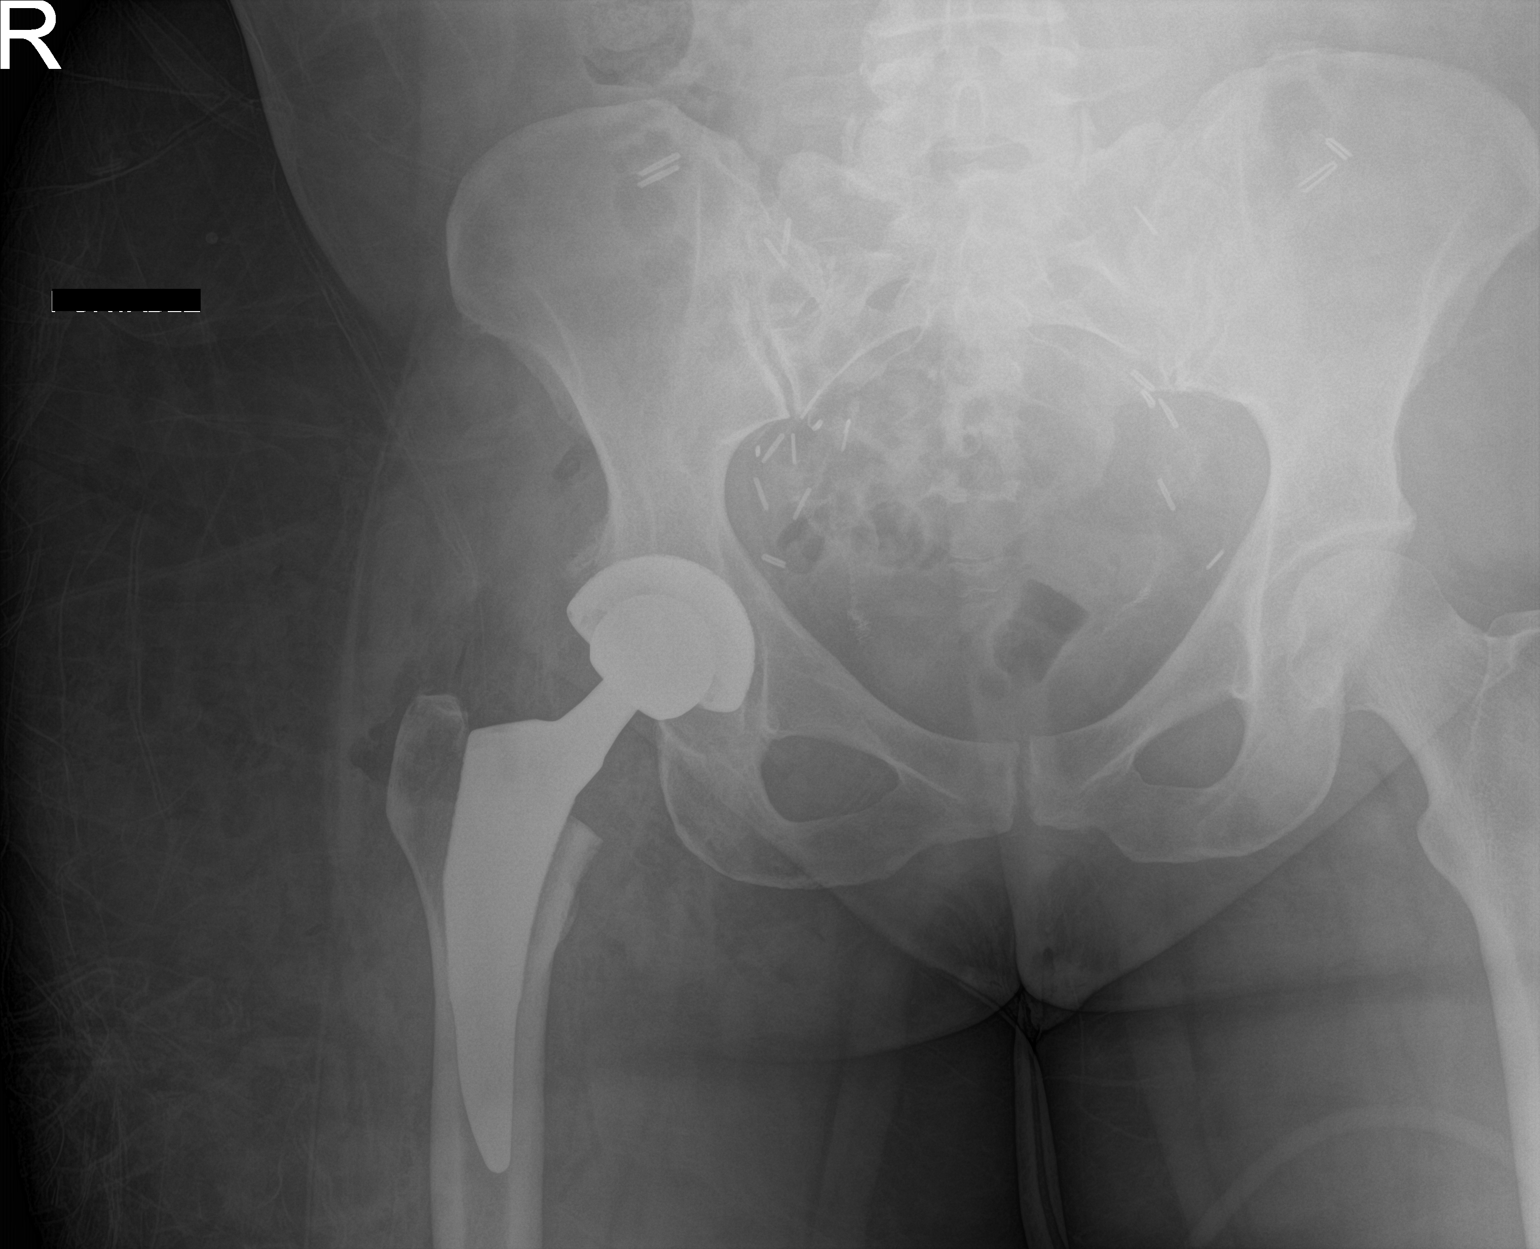

[pelvis ap (2 of 2)]
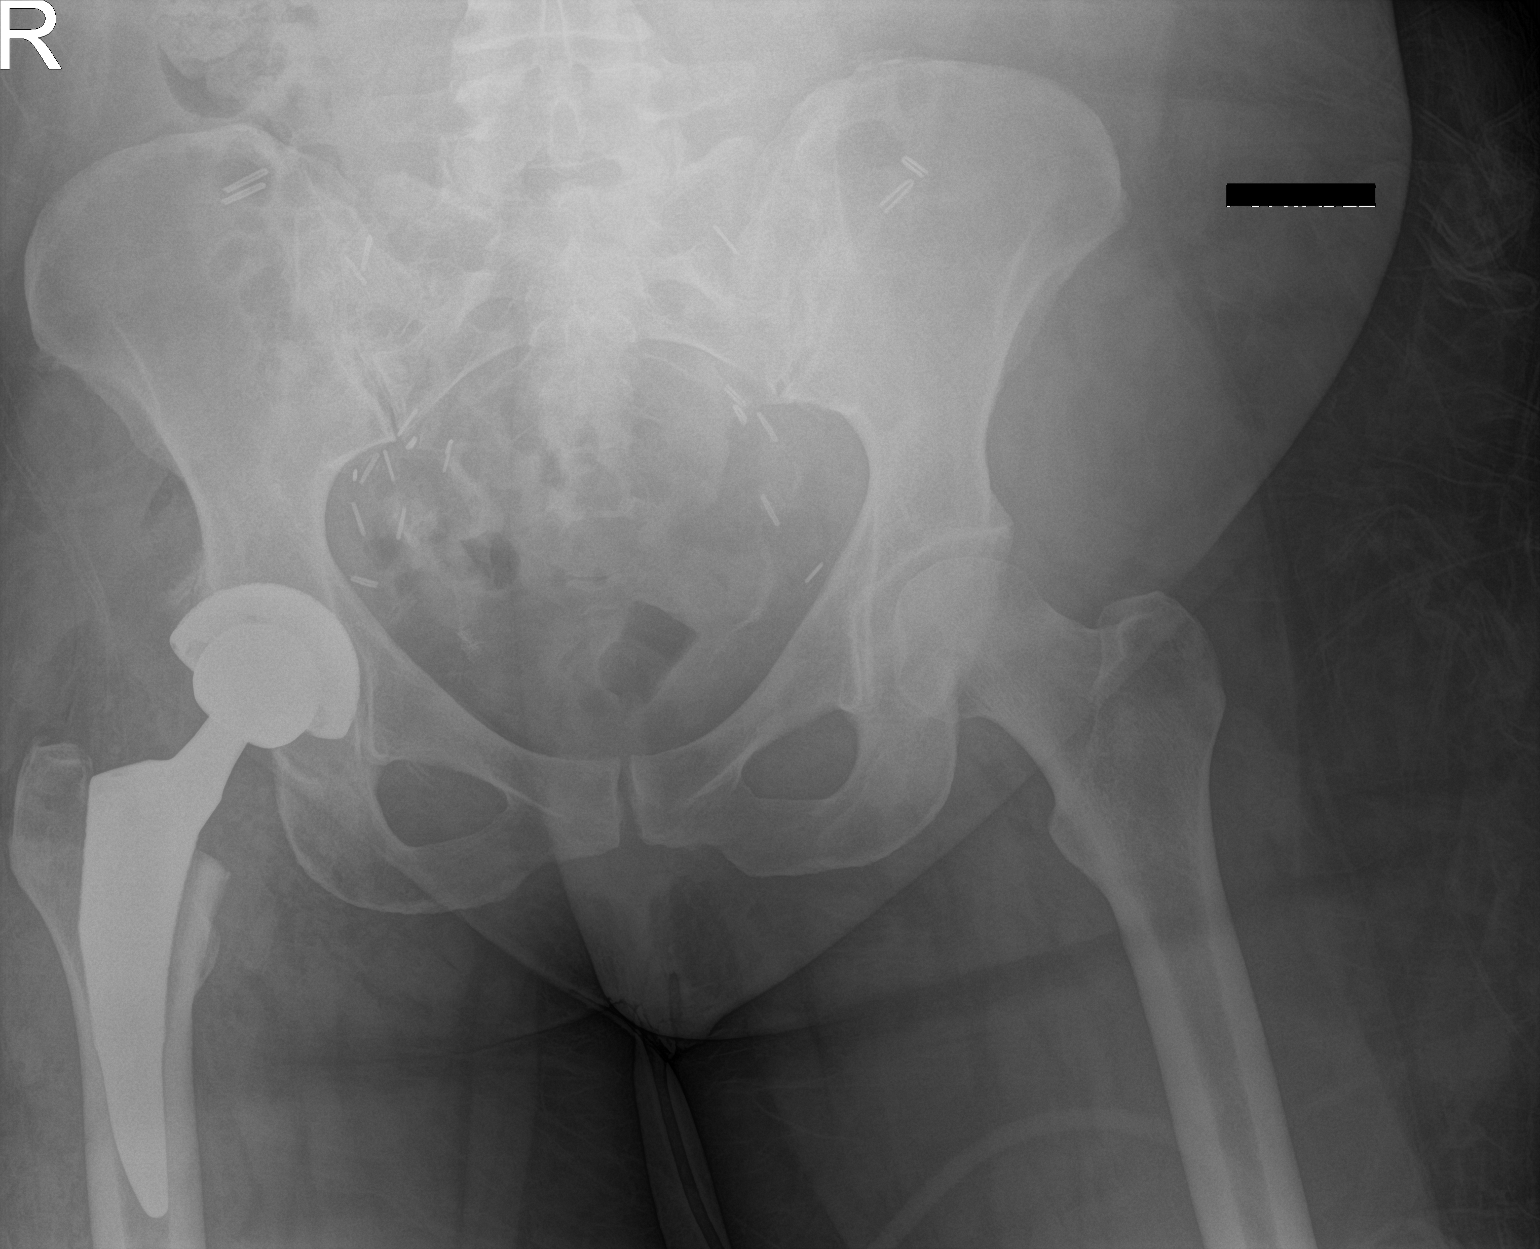

[2 of 2 positions shown; findings below may reference images not displayed]

FINDINGS: Postsurgical changes reflecting right hip arthroplasty are noted.
Hardware alignment is within expected limits, without evidence of
complication. There is expected surrounding postoperative soft
tissue gas. The left hip is normal. The SI joints and symphysis
pubis are intact. Surgical clips are noted projecting over the
pelvis.
IMPRESSION: Status post right hip arthroplasty without evidence of complication.

## 2023-07-10 IMAGING — DX DG PORTABLE PELVIS
1 series · 1 of 1 positions shown · non-contrast
Comparison: 03/17/2022

CLINICAL DATA: Postop check

EXAM:
PORTABLE PELVIS 1-2 VIEWS

[pelvis ap]
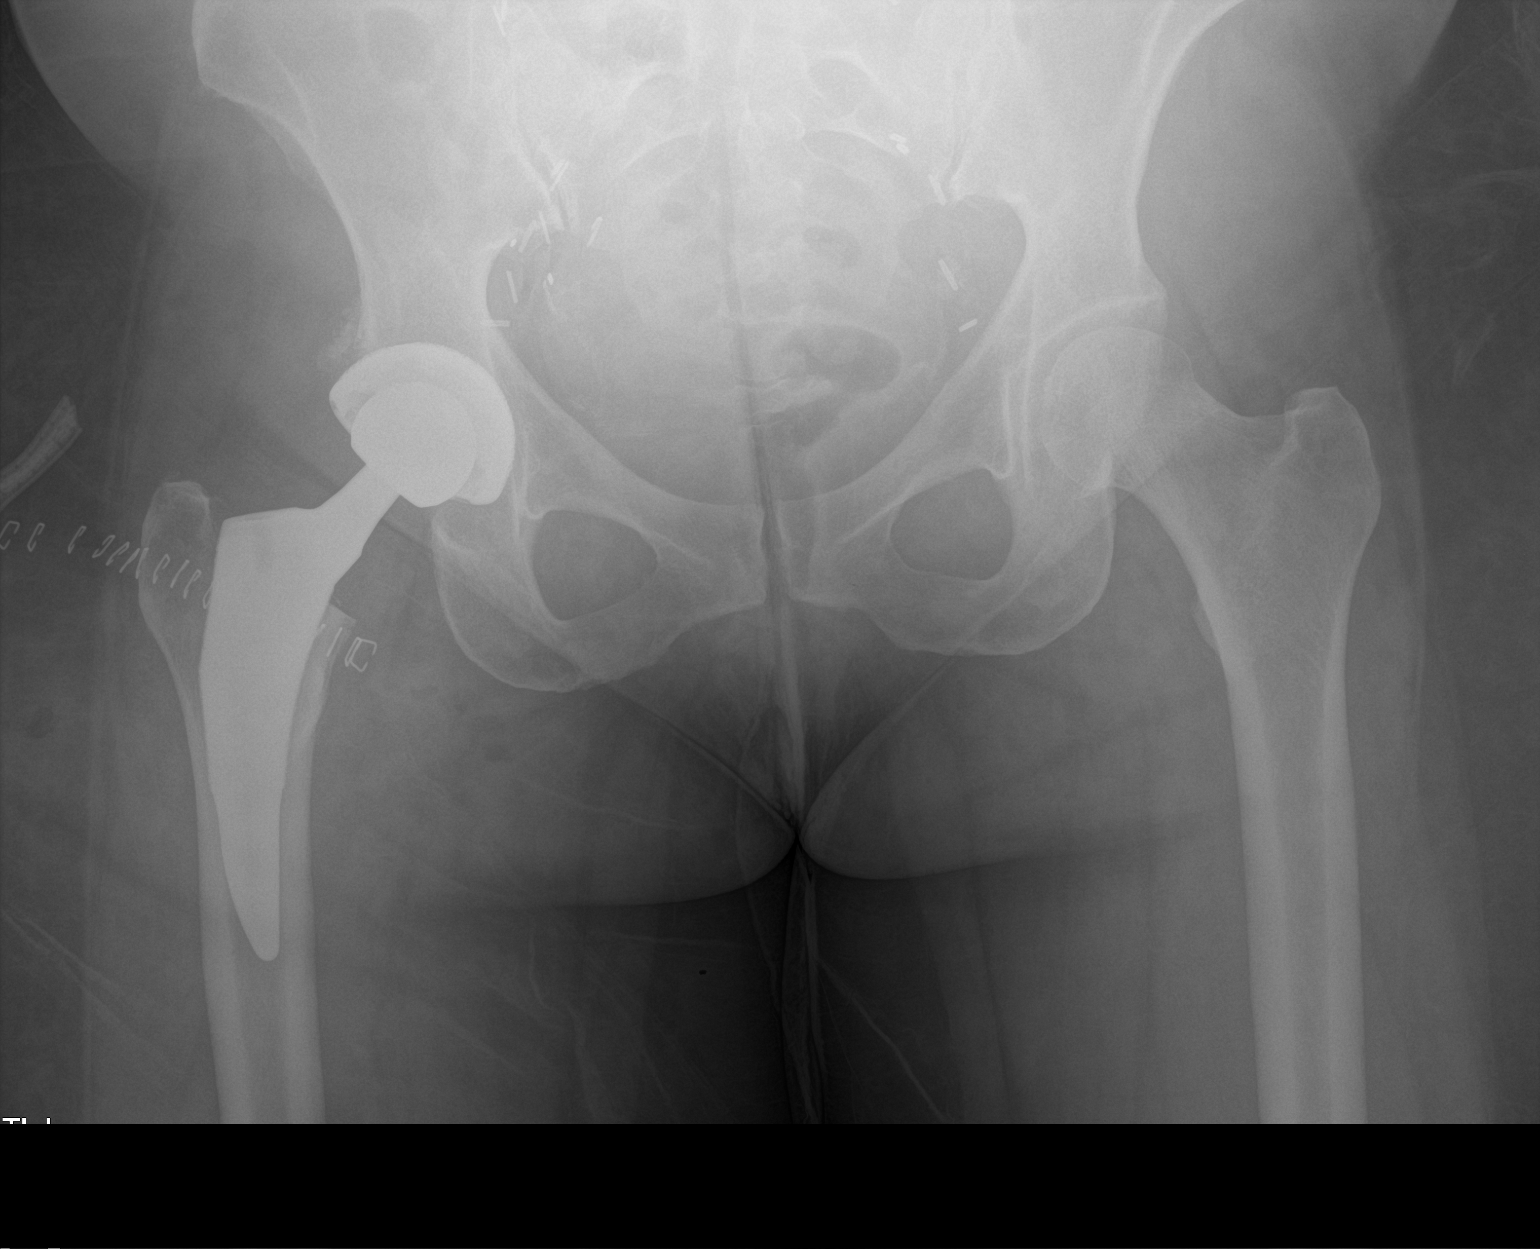

[1 of 1 positions shown; findings below may reference images not displayed]

FINDINGS: Right hip replacement in satisfactory position and alignment. No
change from the prior study. No fracture.
IMPRESSION: Satisfactory right hip replacement.
